# Patient Record
Sex: Female | Born: 1960
Health system: Southern US, Community
[De-identification: ages and names within clinical notes are randomized; demographics above are authoritative.]

## PROBLEM LIST (undated history)

## (undated) DIAGNOSIS — D219 Benign neoplasm of connective and other soft tissue, unspecified: Secondary | ICD-10-CM

## (undated) DIAGNOSIS — J4520 Mild intermittent asthma, uncomplicated: Secondary | ICD-10-CM

## (undated) DIAGNOSIS — M1990 Unspecified osteoarthritis, unspecified site: Secondary | ICD-10-CM

## (undated) DIAGNOSIS — C801 Malignant (primary) neoplasm, unspecified: Secondary | ICD-10-CM

## (undated) DIAGNOSIS — E079 Disorder of thyroid, unspecified: Secondary | ICD-10-CM

## (undated) DIAGNOSIS — I341 Nonrheumatic mitral (valve) prolapse: Secondary | ICD-10-CM

## (undated) DIAGNOSIS — I209 Angina pectoris, unspecified: Secondary | ICD-10-CM

## (undated) DIAGNOSIS — R112 Nausea with vomiting, unspecified: Secondary | ICD-10-CM

## (undated) DIAGNOSIS — R0600 Dyspnea, unspecified: Secondary | ICD-10-CM

## (undated) DIAGNOSIS — E039 Hypothyroidism, unspecified: Secondary | ICD-10-CM

## (undated) DIAGNOSIS — G4730 Sleep apnea, unspecified: Secondary | ICD-10-CM

## (undated) DIAGNOSIS — I10 Essential (primary) hypertension: Secondary | ICD-10-CM

## (undated) HISTORY — PX: SHOULDER SURGERY: SHX246

## (undated) HISTORY — PX: TOE SURGERY: SHX1073

## (undated) HISTORY — DX: Mild intermittent asthma, uncomplicated: J45.20

## (undated) HISTORY — DX: Disorder of thyroid, unspecified: E07.9

## (undated) HISTORY — DX: Nonrheumatic mitral (valve) prolapse: I34.1

---
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
## 2005-06-30 ENCOUNTER — Ambulatory Visit: Payer: Self-pay | Admitting: Unknown Physician Specialty

## 2005-07-12 ENCOUNTER — Ambulatory Visit: Payer: Self-pay | Admitting: Unknown Physician Specialty

## 2007-03-07 ENCOUNTER — Encounter: Payer: Self-pay | Admitting: Family Medicine

## 2007-05-08 ENCOUNTER — Ambulatory Visit: Payer: Self-pay | Admitting: Cardiology

## 2007-05-12 LAB — CONVERTED CEMR LAB: Pap Smear: NORMAL

## 2007-05-23 ENCOUNTER — Ambulatory Visit: Payer: Self-pay | Admitting: Unknown Physician Specialty

## 2007-06-06 ENCOUNTER — Ambulatory Visit: Payer: Self-pay | Admitting: Family Medicine

## 2007-06-06 DIAGNOSIS — D580 Hereditary spherocytosis: Secondary | ICD-10-CM

## 2007-06-06 DIAGNOSIS — M545 Low back pain, unspecified: Secondary | ICD-10-CM | POA: Insufficient documentation

## 2007-06-06 DIAGNOSIS — J309 Allergic rhinitis, unspecified: Secondary | ICD-10-CM | POA: Insufficient documentation

## 2007-06-06 DIAGNOSIS — N3010 Interstitial cystitis (chronic) without hematuria: Secondary | ICD-10-CM

## 2007-06-06 DIAGNOSIS — J45909 Unspecified asthma, uncomplicated: Secondary | ICD-10-CM | POA: Insufficient documentation

## 2007-06-06 DIAGNOSIS — Z8679 Personal history of other diseases of the circulatory system: Secondary | ICD-10-CM | POA: Insufficient documentation

## 2007-06-06 DIAGNOSIS — G43909 Migraine, unspecified, not intractable, without status migrainosus: Secondary | ICD-10-CM | POA: Insufficient documentation

## 2007-06-06 DIAGNOSIS — E049 Nontoxic goiter, unspecified: Secondary | ICD-10-CM | POA: Insufficient documentation

## 2007-06-07 ENCOUNTER — Encounter: Payer: Self-pay | Admitting: Family Medicine

## 2007-09-04 ENCOUNTER — Encounter: Payer: Self-pay | Admitting: Family Medicine

## 2007-09-26 ENCOUNTER — Ambulatory Visit: Payer: Self-pay | Admitting: Family Medicine

## 2007-09-26 DIAGNOSIS — R21 Rash and other nonspecific skin eruption: Secondary | ICD-10-CM

## 2007-10-16 DIAGNOSIS — J019 Acute sinusitis, unspecified: Secondary | ICD-10-CM

## 2007-10-19 ENCOUNTER — Ambulatory Visit: Payer: Self-pay | Admitting: *Deleted

## 2008-07-31 ENCOUNTER — Telehealth: Payer: Self-pay | Admitting: Family Medicine

## 2008-08-02 ENCOUNTER — Encounter (INDEPENDENT_AMBULATORY_CARE_PROVIDER_SITE_OTHER): Payer: Self-pay | Admitting: *Deleted

## 2008-10-01 ENCOUNTER — Ambulatory Visit: Payer: Self-pay | Admitting: Unknown Physician Specialty

## 2009-11-18 ENCOUNTER — Ambulatory Visit: Payer: Self-pay | Admitting: Unknown Physician Specialty

## 2009-12-01 ENCOUNTER — Emergency Department: Payer: Self-pay | Admitting: Emergency Medicine

## 2010-12-01 ENCOUNTER — Ambulatory Visit: Payer: Self-pay | Admitting: Unknown Physician Specialty

## 2011-02-23 NOTE — Assessment & Plan Note (Signed)
[facility] OFFICE NOTE   [name], [name]                     MRN:          [id number]  DATE:05/08/2007                            DOB:          [birth year]    I was asked by Dr.  Elease Hashimoto to evaluate Sophia [name], a very pleasant  50 year old white female, for palpitations and PVCs.   HISTORY OF PRESENT ILLNESS:  She carries a diagnosis of mitral valve  prolapse without significant mitral regurgitation since the late 80s.  She was treated with beta-blockers for a couple of years and then came  off of it. This was diagnosed by a 2D echocardiogram, which I do not  have access to.   Recently, she has been having a lot of problems with palpitations  particularly at night. She will wake up with her heart beating irregular  and she cannot sleep. Several weeks ago, when these first started back,  she was having problems even with activity. She would get a headache on  her treadmill and get a little lightheadedness and stop.   She was seen by Dr.  Arnoldo Hooker of [facility]. He performed a  24-hour Holter monitor which showed 4625 PVCs, no runs and no pairs.  There were 11 events of ventricular bigeminy, 2 of ventricular  trigeminy. There was no SVT or runs and no other arrhythmias.   When she pressed the event recorder it was associated with ventricular  premature beats, but there were also others that did not.   She had a stress echo which showed normal treadmill EKG without evidence  of ischemia or arrhythmia. She exercised for 9 minutes achieving 10  METs. She achieved 95% of her peak heart rate. Her blood pressure  response was adequate. There were no arrhythmias. Her stress echo showed  no evidence of ischemia.   She has not had blood work as best as I can tell.   PAST MEDICAL HISTORY:  She does not smoke, drink or use any illicit  drugs. She does use a little bit of caffeine, but on a previous  record  she was drinking two 20 ounce caffeinated beverages a day.   PAST SURGICAL HISTORY:  Splenectomy.   CURRENT MEDICATIONS:  None.   ALLERGIES:  TEQUIN. She has no history of a dye allergy.   FAMILY HISTORY:  Is really noncontributory.   SOCIAL HISTORY:  She is single. She has no children.   REVIEW OF SYSTEMS:  History of asthma and hereditary spherocytosis. She  has had a splenectomy.   PHYSICAL EXAMINATION:  Her blood pressure is 122/72, pulse 82 and  regular. Weight is 179.  HEENT: Normocephalic, atraumatic. PERRLA. Extra-ocular movements intact.  Sclerae are clear. Facial symmetry is normal. Carotid upstrokes are  equal bilaterally without bruits. There is no JVD.  Thyroid is not  enlarged. Trachea is midline.  LUNGS:  Are clear.  HEART: Reveals a nondisplaced PMI and she has a mid systolic click.  There is no significant murmur. S2 splits physiologically.  ABDOMEN: Soft with good bowel sounds. There is no midline bruit.  EXTREMITIES: Reveals no cyanosis, clubbing or edema. Pulses are intact.  NEURO: Intact.  SKIN: Is unremarkable.   EKG from outside on Mar 07, 2007, showed normal sinus rhythm, normal PR  interval, normal QRS, normal QTC and no abnormalities in V1 and V2.   ASSESSMENT/PLAN:  Benign tachy palpitations associated with ventricular  ectopy that is non-complex. She had some of this with exercise at least  historically. Beta-blockers have made her worse and really did not help  her. She really does not want to take a long acting drug. Her mitral  valve prolapse is stable.   RECOMMENDATIONS:  1. P.r.n. Diltiazem 60 mg for palpitations.  2. Regular exercise.  3. Reassurance.     Thomas C. Daleen Squibb, MD, [facility]  Electronically Signed    TCW/MedQ  DD: 05/08/2007  DT: 05/08/2007  Job #: [id number]   cc:   Lorie Phenix

## 2011-03-19 ENCOUNTER — Encounter: Payer: Self-pay | Admitting: Family Medicine

## 2011-12-03 ENCOUNTER — Ambulatory Visit: Payer: Self-pay | Admitting: Family Medicine

## 2012-08-10 ENCOUNTER — Ambulatory Visit: Payer: Self-pay | Admitting: Family Medicine

## 2013-01-11 ENCOUNTER — Ambulatory Visit: Payer: Self-pay | Admitting: Family Medicine

## 2013-08-07 ENCOUNTER — Ambulatory Visit: Payer: Self-pay | Admitting: Family Medicine

## 2013-08-14 ENCOUNTER — Ambulatory Visit: Payer: Self-pay | Admitting: Family Medicine

## 2013-10-26 DIAGNOSIS — E039 Hypothyroidism, unspecified: Secondary | ICD-10-CM | POA: Insufficient documentation

## 2013-11-01 ENCOUNTER — Ambulatory Visit: Payer: Self-pay | Admitting: Gastroenterology

## 2014-03-07 ENCOUNTER — Ambulatory Visit (INDEPENDENT_AMBULATORY_CARE_PROVIDER_SITE_OTHER): Payer: 59 | Admitting: Podiatrist

## 2014-03-07 ENCOUNTER — Encounter: Payer: Self-pay | Admitting: Podiatrist

## 2014-03-07 VITALS — BP 131/70 | HR 85 | Resp 16

## 2014-03-07 DIAGNOSIS — M205X2 Other deformities of toe(s) (acquired), left foot: Secondary | ICD-10-CM

## 2014-03-07 DIAGNOSIS — M7750 Other enthesopathy of unspecified foot: Secondary | ICD-10-CM

## 2014-03-07 DIAGNOSIS — M779 Enthesopathy, unspecified: Secondary | ICD-10-CM

## 2014-03-07 DIAGNOSIS — M205X9 Other deformities of toe(s) (acquired), unspecified foot: Secondary | ICD-10-CM

## 2014-03-07 DIAGNOSIS — M778 Other enthesopathies, not elsewhere classified: Secondary | ICD-10-CM

## 2014-03-07 MED ORDER — TRIAMCINOLONE ACETONIDE 10 MG/ML IJ SUSP
10.0000 mg | Freq: Once | INTRAMUSCULAR | Status: AC
  Administered 2014-03-07: 10 mg

## 2014-03-07 NOTE — Progress Notes (Signed)
Chief Complaint  Patient presents with  . Foot Pain    i want another shot in my left foot     HPI: Patient is 53 y.o. female who presents today for pain at the first mpj of the left foot.  She states the last injecdtion she got in august 2014 helped up till now and her back is starting to bother her from bearing weight differently on her foot.       Physical Exam GENERAL APPEARANCE: Alert, conversant. Appropriately groomed. No acute distress.  VASCULAR: Pedal pulses palpable at 2/4 DP and PT bilateral.  Capillary refill time is immediate to all digits,  Proximal to distal cooling it warm to warm.  Digital hair growth is present bilateral  NEUROLOGIC: sensation is intact epicritically and protectively to 5.07 monofilament at 5/5 sites bilateral.  Light touch is intact bilateral, vibratory sensation intact bilateral, achilles tendon reflex is intact bilateral.  MUSCULOSKELETAL: hallux limitus with decreased joint space on xray is noted.  Swelling at the first mpj is present.  Otherwise acceptable muscle strength, tone and stability bilateral.  Intrinsic muscluature intact bilateral.  Rectus appearance of foot and digits noted bilateral.   DERMATOLOGIC: skin color, texture, and turger are within normal limits.  No preulcerative lesions are seen, no interdigital maceration noted.  No open lesions present.  Digital nails are asymptomatic.    Assessment: Hallux limitus, capsulitis left  Plan: Injected the area under sterile technique at the first metatarsophalangeal joint left with Kenalog and Marcaine mixture. Discussed the possibility of an implant procedure however I recommended waiting until shots are no longer effective or and less the pain gets increasingly worse and worse. She will call if she needs another injection her for a surgical consultation otherwise she'll be seen back as needed for followup.

## 2014-03-07 NOTE — Patient Instructions (Signed)
Hallux Rigidus Hallux rigidus is a condition involving pain and a loss of motion of the first (big) toe. The pain gets worse with lifting up (extension) of the toe. This is usually due to arthritic bony bumps (spurring) of the joint at the base of the big toe.  SYMPTOMS   Pain, with lifting up of the toe.  Tenderness over the joint where the big toe meets the foot.  Redness, swelling, and warmth over the top of the base of the big toe (sometimes).  Foot pain, stiffness, and limping. CAUSES  Halllux rigidus is caused by arthritis of the joint where the big toe meets the foot. The arthritis creates a bone spur that pinches the soft tissues, when the toe is extended. RISK INCREASES WITH:  Tight shoes, with a narrow toe box.  Family history of foot problems.  Gout and rheumatoid and psoriatic arthritis.  History of previous toe injury, including "turf toe."  Long first toe, flat feet, and other big toe bony bumps.  Arthritis of the big toe. PREVENTION   Wear wide toed shoes that fit well.  Tape the big toe, to reduce motion and to prevent pinching of the tissues between the bone.  Maintain physical fitness:  Foot and ankle flexibility.  Muscle strength and endurance. PROGNOSIS  This condition can usually be managed with proper treatment. However, surgery is typically required to prevent the problem from recurring.  RELATED COMPLICATIONS  Injury to other areas of the foot or ankle, caused by abnormal walking in an attempt to avoid the pain felt when walking normally. TREATMENT Treatment first involves stopping the activities that aggravate your symptoms. Ice and medicine can be used to reduce the pain and inflammation. Modifications to shoes may help reduce pain, including wearing stiff-soled shoes, shoes with a wide toe box, inserting a padded donut to relieve pressure on top of the joint, or wearing an arch support. Corticosteroid injections may be given to reduce inflammation.  If non-surgical treatment is unsuccessful, surgery may be needed. Surgical options include removing the arthritic bony spur, cutting a bone in the foot to change the arc of motion (allowing the toe to extend more), or fusion of the joint (eliminating all motion in the joint at the base of the big toe).  MEDICATION   If pain medicine is needed, nonsteroidal anti-inflammatory medicines (aspirin and ibuprofen), or other minor pain relievers (acetaminophen), are often advised.  Do not take pain medicine for 7 days before surgery.  Prescription pain relievers are usually prescribed only after surgery. Use only as directed and only as much as you need.  Ointments for arthritis, applied to the skin, may give some relief.  Injections of corticosteroids may be given to reduce inflammation. HEAT AND COLD  Cold treatment (icing) relieves pain and reduces inflammation. Cold treatment should be applied for 10 to 15 minutes every 2 to 3 hours, and immediately after activity that aggravates your symptoms. Use ice packs or an ice massage.  Heat treatment may be used before performing the stretching and strengthening activities prescribed by your caregiver, physical therapist, or athletic trainer. Use a heat pack or a warm water soak. SEEK MEDICAL CARE IF:   Symptoms get worse or do not improve in 2 weeks, despite treatment.  After surgery you develop fever, increasing pain, redness, swelling, drainage of fluids, bleeding, or increasing warmth.  New, unexplained symptoms develop. (Drugs used in treatment may produce side effects.) Document Released: 09/27/2005 Document Revised: 12/20/2011 Document Reviewed: 01/09/2009 [employer] Patient   Information 2014 [employer].  

## 2015-02-11 ENCOUNTER — Other Ambulatory Visit: Payer: Self-pay | Admitting: Gastroenterology

## 2015-02-11 DIAGNOSIS — R748 Abnormal levels of other serum enzymes: Secondary | ICD-10-CM

## 2015-02-25 ENCOUNTER — Ambulatory Visit
Admission: RE | Admit: 2015-02-25 | Discharge: 2015-02-25 | Disposition: A | Discharge status: Home / Self Care | Payer: 59 | Source: Ambulatory Visit | Attending: Gastroenterology | Admitting: Gastroenterology

## 2015-02-25 ENCOUNTER — Other Ambulatory Visit: Payer: Self-pay | Admitting: Gastroenterology

## 2015-02-25 DIAGNOSIS — M79632 Pain in left forearm: Secondary | ICD-10-CM | POA: Insufficient documentation

## 2015-02-25 DIAGNOSIS — R748 Abnormal levels of other serum enzymes: Secondary | ICD-10-CM

## 2015-02-25 DIAGNOSIS — M7989 Other specified soft tissue disorders: Secondary | ICD-10-CM | POA: Insufficient documentation

## 2015-02-25 MED ORDER — GADOBENATE DIMEGLUMINE 529 MG/ML IV SOLN
15.0000 mL | Freq: Once | INTRAVENOUS | Status: AC | PRN
  Administered 2015-02-25: 15 mL via INTRAVENOUS

## 2015-02-27 ENCOUNTER — Other Ambulatory Visit: Payer: Self-pay | Admitting: Family Medicine

## 2015-02-27 ENCOUNTER — Ambulatory Visit
Admission: RE | Admit: 2015-02-27 | Discharge: 2015-02-27 | Disposition: A | Discharge status: Home / Self Care | Payer: 59 | Source: Ambulatory Visit | Attending: Family Medicine | Admitting: Family Medicine

## 2015-02-27 DIAGNOSIS — M79632 Pain in left forearm: Secondary | ICD-10-CM

## 2015-02-27 DIAGNOSIS — I809 Phlebitis and thrombophlebitis of unspecified site: Secondary | ICD-10-CM | POA: Insufficient documentation

## 2015-02-27 DIAGNOSIS — M7989 Other specified soft tissue disorders: Secondary | ICD-10-CM

## 2015-07-07 DIAGNOSIS — R748 Abnormal levels of other serum enzymes: Secondary | ICD-10-CM | POA: Insufficient documentation

## 2015-11-26 ENCOUNTER — Other Ambulatory Visit: Payer: Self-pay | Admitting: Family Medicine

## 2015-11-26 DIAGNOSIS — M542 Cervicalgia: Secondary | ICD-10-CM

## 2015-11-28 ENCOUNTER — Ambulatory Visit
Admission: RE | Admit: 2015-11-28 | Discharge: 2015-11-28 | Disposition: A | Discharge status: Home / Self Care | Payer: 59 | Source: Ambulatory Visit | Attending: Family Medicine | Admitting: Family Medicine

## 2015-11-28 DIAGNOSIS — M542 Cervicalgia: Secondary | ICD-10-CM | POA: Diagnosis not present

## 2015-11-28 DIAGNOSIS — R599 Enlarged lymph nodes, unspecified: Secondary | ICD-10-CM | POA: Insufficient documentation

## 2016-06-28 ENCOUNTER — Other Ambulatory Visit: Payer: Self-pay | Admitting: Family Medicine

## 2016-06-28 DIAGNOSIS — Z1231 Encounter for screening mammogram for malignant neoplasm of breast: Secondary | ICD-10-CM

## 2016-08-12 ENCOUNTER — Ambulatory Visit
Admission: RE | Admit: 2016-08-12 | Discharge: 2016-08-12 | Disposition: A | Discharge status: Home / Self Care | Payer: 59 | Source: Ambulatory Visit | Attending: Family Medicine | Admitting: Family Medicine

## 2016-08-12 DIAGNOSIS — Z1231 Encounter for screening mammogram for malignant neoplasm of breast: Secondary | ICD-10-CM | POA: Insufficient documentation

## 2016-08-19 ENCOUNTER — Other Ambulatory Visit: Payer: Self-pay | Admitting: *Deleted

## 2016-08-19 ENCOUNTER — Inpatient Hospital Stay
Admission: RE | Admit: 2016-08-19 | Discharge: 2016-08-19 | Disposition: A | Discharge status: Home / Self Care | Payer: Self-pay | Source: Ambulatory Visit | Attending: *Deleted | Admitting: *Deleted

## 2016-08-19 DIAGNOSIS — Z9289 Personal history of other medical treatment: Secondary | ICD-10-CM

## 2016-12-16 ENCOUNTER — Emergency Department
Admission: EM | Admit: 2016-12-16 | Discharge: 2016-12-17 | Disposition: A | Discharge status: Home / Self Care | Payer: 59 | Attending: Emergency Medicine | Admitting: Emergency Medicine

## 2016-12-16 ENCOUNTER — Emergency Department: Payer: 59

## 2016-12-16 ENCOUNTER — Encounter: Payer: Self-pay | Admitting: Emergency Medicine

## 2016-12-16 DIAGNOSIS — J45909 Unspecified asthma, uncomplicated: Secondary | ICD-10-CM | POA: Insufficient documentation

## 2016-12-16 DIAGNOSIS — R7989 Other specified abnormal findings of blood chemistry: Secondary | ICD-10-CM

## 2016-12-16 DIAGNOSIS — Z79899 Other long term (current) drug therapy: Secondary | ICD-10-CM | POA: Diagnosis not present

## 2016-12-16 DIAGNOSIS — R0789 Other chest pain: Secondary | ICD-10-CM | POA: Insufficient documentation

## 2016-12-16 DIAGNOSIS — R079 Chest pain, unspecified: Secondary | ICD-10-CM

## 2016-12-16 LAB — COMPREHENSIVE METABOLIC PANEL
ALK PHOS: 108 U/L (ref 38–126)
ALT: 34 U/L (ref 14–54)
AST: 34 U/L (ref 15–41)
Albumin: 4 g/dL (ref 3.5–5.0)
Anion gap: 7 (ref 5–15)
BUN: 10 mg/dL (ref 6–20)
CO2: 28 mmol/L (ref 22–32)
CREATININE: 0.8 mg/dL (ref 0.44–1.00)
Calcium: 8.8 mg/dL — ABNORMAL LOW (ref 8.9–10.3)
Chloride: 106 mmol/L (ref 101–111)
GFR calc Af Amer: >60 mL/min (ref >60)
GFR calc non Af Amer: >60 mL/min (ref >60)
GLUCOSE: 106 mg/dL — AB (ref 65–99)
Potassium: 3.6 mmol/L (ref 3.5–5.1)
SODIUM: 141 mmol/L (ref 135–145)
Total Bilirubin: 0.7 mg/dL (ref 0.3–1.2)
Total Protein: 7.3 g/dL (ref 6.5–8.1)

## 2016-12-16 LAB — CBC
HEMATOCRIT: 40.1 % (ref 35.0–47.0)
Hemoglobin: 14.8 g/dL (ref 12.0–16.0)
MCH: 34.7 pg — ABNORMAL HIGH (ref 26.0–34.0)
MCHC: 36.8 g/dL — ABNORMAL HIGH (ref 32.0–36.0)
MCV: 94.4 fL (ref 80.0–100.0)
Platelets: 576 10*3/uL — ABNORMAL HIGH (ref 150–440)
RBC: 4.25 MIL/uL (ref 3.80–5.20)
RDW: 13.3 % (ref 11.5–14.5)
WBC: 12 10*3/uL — AB (ref 3.6–11.0)

## 2016-12-16 LAB — FIBRIN DERIVATIVES D-DIMER (ARMC ONLY): Fibrin derivatives D-dimer (ARMC): 521.09 — ABNORMAL HIGH (ref 0.00–499.00)

## 2016-12-16 LAB — TROPONIN I: Troponin I: <0.03 ng/mL (ref <0.03)

## 2016-12-16 MED ORDER — LORAZEPAM 1 MG PO TABS: 1.0000 mg | ORAL_TABLET | Freq: Once | ORAL | Status: DC

## 2016-12-16 MED ORDER — IOPAMIDOL (ISOVUE-370) INJECTION 76%: 75.0000 mL | Freq: Once | INTRAVENOUS | Status: DC | PRN

## 2016-12-16 NOTE — ED Triage Notes (Signed)
Pt states chest, back, shoulder pain, heart palpatations and chest pain on and off x 2-3 weeks.

## 2016-12-16 NOTE — ED Notes (Signed)
Pt in US, introduced self to pt's family member

## 2016-12-16 NOTE — ED Provider Notes (Signed)
Time Seen: Approximately 2058  I have reviewed the triage notes  Chief Complaint: Chest Pain   History of Present Illness: Sophia [name] is a 56 y.o. female *who presents with some intermittent chest discomfort with heart palpitations over the last 2-3 weeks. She denies any shortness of breath or syncopal episode. She describes pain toward the left side of her chest into her back region. She denies any nausea, vomiting, fever, pleuritic component. She denies any leg pain or swelling and has no history of pulmonary emboli risk factors. She does have a history of hereditary for a cytosis along with asthma. She denies any exacerbation of her asthma type symptoms.  Past Medical History:  Diagnosis Date  . Allergic rhinitis   . Asthma, mild intermittent     Patient Active Problem List   Diagnosis Date Noted  . ACUTE SINUSITIS, UNSPECIFIED 10/16/2007  . RASH AND OTHER NONSPECIFIC SKIN ERUPTION 09/26/2007  . GOITER NOS 06/06/2007  . SPHEROCYTOSIS, HEREDITARY 06/06/2007  . MIGRAINE HEADACHE 06/06/2007  . ALLERGIC RHINITIS 06/06/2007  . ASTHMA 06/06/2007  . INTERSTITIAL CYSTITIS 06/06/2007  . LOW BACK PAIN, CHRONIC 06/06/2007  . Personal history of unspecified circulatory disease 06/06/2007    Past Surgical History:  Procedure Laterality Date  . SHOULDER SURGERY  1990s   Left  . SPLENECTOMY  1978    Past Surgical History:  Procedure Laterality Date  . SHOULDER SURGERY  1990s   Left  . SPLENECTOMY  1978    Current Outpatient Rx  . Order #: [id number] Class: Historical Med  . Order #: [id number] Class: Historical Med  . Order #: [id number] Class: Historical Med  . Order #: [id number] Class: Historical Med  . Order #: [id number] Class: Historical Med    Allergies:  Phenergan [promethazine hcl] and Sulfamethoxazole-trimethoprim  Family History: Family History  Problem Relation Age of Onset  . Obesity Mother   . Coronary artery disease Mother   . Diabetes Mother   . Heart  failure Mother     CHF  . Hypertension Father   . Stroke Maternal Grandmother     CVA  . Parkinsonism Maternal Grandfather   . Colon cancer Paternal Grandmother   . Stroke Paternal Grandmother     CVA  . Stroke Paternal Grandfather     CVA  . Breast cancer Other     Aunt    Social History: Social History  Substance Use Topics  . Smoking status: Never Smoker  . Smokeless tobacco: Never Used  . Alcohol use No     Review of Systems:   10 point review of systems was performed and was otherwise negative:  Constitutional: No fever Eyes: No visual disturbances ENT: No sore throat, ear pain Cardiac: Chest pain seems to be not associated with movement or fever or productive cough etc. She states she does have some of discomfort at this time. Respiratory: No shortness of breath, wheezing, or stridor Abdomen: No abdominal pain, no vomiting, No diarrhea Endocrine: No weight loss, No night sweats Extremities: No peripheral edema, cyanosis Skin: No rashes, easy bruising Neurologic: No focal weakness, trouble with speech or swollowing Urologic: No dysuria, Hematuria, or urinary frequency   Physical Exam:  ED Triage Vitals  Enc Vitals Group     BP 12/16/16 2022 (!) 160/82     Pulse Rate 12/16/16 2022 86     Resp 12/16/16 2022 16     Temp 12/16/16 2022 98.6 F (37 C)     Temp src --  SpO2 12/16/16 2022 98 %     Weight 12/16/16 2023 195 lb (88.5 kg)     Height 12/16/16 2023 5\' 6"  (1.676 m)     Head Circumference --      Peak Flow --      Pain Score 12/16/16 2023 3     Pain Loc --      Pain Edu? --      Excl. in GC? --     General: Awake , Alert , and Oriented times 3; GCS 15 Anxious Head: Normal cephalic , atraumatic Eyes: Pupils equal , round, reactive to light Nose/Throat: No nasal drainage, patent upper airway without erythema or exudate.  Neck: Supple, Full range of motion, No anterior adenopathy or palpable thyroid masses Lungs: Clear to ascultation without  wheezes , rhonchi, or rales Heart: Regular rate, regular rhythm with mild systolic ejection murmur left sternal border  Abdomen: Soft, non tender without rebound, guarding , or rigidity; bowel sounds positive and symmetric in all 4 quadrants. No organomegaly .        Extremities: 2 plus symmetric pulses. No edema, clubbing or cyanosis. No calf tenderness or swelling Neurologic: normal ambulation, Motor symmetric without deficits, sensory intact Skin: warm, dry, no rashes   Labs:   All laboratory work was reviewed including any pertinent negatives or positives listed below:  Labs Reviewed  CBC - Abnormal; Notable for the following:       Result Value   WBC 12.0 (*)    MCH 34.7 (*)    MCHC 36.8 (*)    Platelets 576 (*)    All other components within normal limits  FIBRIN DERIVATIVES D-DIMER ([facility] ONLY) - Abnormal; Notable for the following:    Fibrin derivatives D-dimer ([facility]) 521.09 (*)    All other components within normal limits  COMPREHENSIVE METABOLIC PANEL - Abnormal; Notable for the following:    Glucose, Bld 106 (*)    Calcium 8.8 (*)    All other components within normal limits  TROPONIN I    EKG: ED ECG REPORT I, Jennye Moccasin, the attending physician, personally viewed and interpreted this ECG.  Date: 12/16/2016 EKG Time: 2014 Rate: 97 Rhythm: normal sinus rhythm QRS Axis: normal Intervals: normal ST/T Wave abnormalities: normal Conduction Disturbances: none Narrative Interpretation: unremarkable    Radiology: * "Dg Chest 2 View  Result Date: 12/16/2016 CLINICAL DATA:  Chest back and shoulder pain with heart palpitations EXAM: CHEST  2 VIEW COMPARISON:  08/07/2013 FINDINGS: The heart size and mediastinal contours are within normal limits. Both lungs are clear. Degenerative changes of the spine. Surgical clip in the left upper quadrant as before. IMPRESSION: No active cardiopulmonary disease. Electronically Signed   By: Jasmine Pang M.D.   On: 12/16/2016  20:48   Ct Chest Wo Contrast  Result Date: 12/16/2016 CLINICAL DATA:  Cough and shoulder pain heart palpitations EXAM: CT CHEST WITHOUT CONTRAST TECHNIQUE: Multidetector CT imaging of the chest was performed following the standard protocol without IV contrast. COMPARISON:  Radiograph 12/16/2016, CT abdomen pelvis 08/14/2013 FINDINGS: Cardiovascular: Limited without intravenous contrast. Non aneurysmal aorta. Normal heart size. No significant pericardial effusion. Mediastinum/Nodes: No enlarged mediastinal or axillary lymph nodes. Thyroid gland, trachea, and esophagus demonstrate no significant findings. Lungs/Pleura: No acute infiltrate or effusion. Stable 5 mm right lower lobe pulmonary nodule. 2 mm subpleural right upper lobe pulmonary nodule. 4 mm subpleural left upper lobe pulmonary nodule. Upper Abdomen: No acute abnormality in the upper abdomen. Musculoskeletal: Degenerative changes. No acute  or suspicious bone lesion. IMPRESSION: 1. No acute abnormalities are visualized. Lung fields show no acute infiltrates, pleural effusion or pneumothorax 2. Scattered pulmonary nodules. No follow-up needed if patient is low-risk (and has no known or suspected primary neoplasm). Non-contrast chest CT can be considered in 12 months if patient is high-risk. This recommendation follows the consensus statement: Guidelines for Management of Incidental Pulmonary Nodules Detected on CT Images: From the Fleischner Society 2017; Radiology 2017; 284:228-243. Electronically Signed   By: Jasmine PangKim  Fujinaga M.D.   On: 12/16/2016 22:50  "  I personally reviewed the radiologic studies   ED Course: * Differential includes all life-threatening causes for chest pain. This includes but is not exclusive to acute coronary syndrome, aortic dissection, pulmonary embolism, cardiac tamponade, community-acquired pneumonia, pericarditis, musculoskeletal chest wall pain, etc.  Patient's presentation her chest pains are atypical. Her D-dimer  test was only borderline positive and unfortunately patient has a dye allergy. We performed a chest CT to rule out thoracic dissection there is no other pathology other than some pulmonary nodules which are likely incidental. The patient's awaiting Doppler studies will repeat her cardiac enzyme at a 3 hour note from her first levels. Thus far I don't have a strong suspicion for acute coronary syndrome. The patient has a history of mitral valve prolapse and likely require outpatient cardiology evaluation.     Assessment: * Acute atypical chest pain     Plan: * Repeat cardiac enzyme and Doppler studies to rule out DVT.          Jennye MoccasinBrian S Quigley, MD 12/16/16 [phone]

## 2016-12-16 NOTE — ED Notes (Signed)
Pt taken from CT to US via stretcher

## 2016-12-17 ENCOUNTER — Emergency Department: Admission: EM | Admit: 2016-12-17 | Discharge: 2016-12-17 | Discharge status: Against Medical Advice | Payer: 59

## 2016-12-17 ENCOUNTER — Other Ambulatory Visit: Payer: Self-pay | Admitting: Physician Assistant

## 2016-12-17 ENCOUNTER — Telehealth: Payer: Self-pay

## 2016-12-17 ENCOUNTER — Telehealth: Payer: Self-pay | Admitting: Physician Assistant

## 2016-12-17 ENCOUNTER — Ambulatory Visit: Payer: 59 | Admitting: Physician Assistant

## 2016-12-17 ENCOUNTER — Encounter
Admission: RE | Admit: 2016-12-17 | Discharge: 2016-12-17 | Disposition: A | Discharge status: Home / Self Care | Payer: 59 | Source: Ambulatory Visit | Attending: Physician Assistant | Admitting: Physician Assistant

## 2016-12-17 ENCOUNTER — Encounter: Payer: Self-pay | Admitting: Physician Assistant

## 2016-12-17 DIAGNOSIS — R079 Chest pain, unspecified: Secondary | ICD-10-CM

## 2016-12-17 DIAGNOSIS — R7989 Other specified abnormal findings of blood chemistry: Secondary | ICD-10-CM | POA: Insufficient documentation

## 2016-12-17 DIAGNOSIS — R0602 Shortness of breath: Secondary | ICD-10-CM

## 2016-12-17 LAB — TROPONIN I: Troponin I: <0.03 ng/mL (ref <0.03)

## 2016-12-17 MED ORDER — TECHNETIUM TO 99M ALBUMIN AGGREGATED
4.0000 | Freq: Once | INTRAVENOUS | Status: AC | PRN
  Administered 2016-12-17: 4.183 via INTRAVENOUS

## 2016-12-17 MED ORDER — TECHNETIUM TC 99M DIETHYLENETRIAME-PENTAACETIC ACID
30.0000 | Freq: Once | INTRAVENOUS | Status: AC | PRN
  Administered 2016-12-17: 32.938 via INTRAVENOUS

## 2016-12-17 NOTE — Progress Notes (Addendum)
    Patient was initially admitted to my schedule this afternoon for ED follow-up of chest pain. Upon initial chart review it was seen the patient had troponin negative 2, elevated d-dimer at 521, and white blood cell count 12,000. Patient underwent lower extremity ultrasound that was negative for bilateral DVT. Unfortunately, it appears the patient underwent CT chest without contrast rather than a CTA PE protocol study. Our office got back in touch with the patient to advise her this as well as to advise her to proceed to the ER for further workup of her elevated d-dimer. Patient reported she is allergic to contrast media which is why the CTA was not performed. ED performed CT chest without contrast, apparently to evaluate for thoracic dissection. Unfortunately this test does not exclude thoracic dissection nor does it evaluate for pulmonary embolism in the setting of her elevated d-dimer.   Upon reviewing patient's allergies that were apparently updated on 12/16/2016, there is no mention of a contrast allergy. If patient truly in fact does have contrast allergy she should be further evaluated with a VQ scan to evaluate for pulmonary embolism in the setting of her chest pain with abnormal d-dimer vs premedicating for contrast allergy and performing CTA chest if there is concern for thoracic dissection. Patient has been advised to go to the ER for appropriate workup.  Will defer adding contrast media to patient's allergies to the ED as day are currently directing the patient's care. Case was discussed with colleagues as well as administration who all agree with the above. Patient was given the number to the office of patient experience.

## 2016-12-17 NOTE — Telephone Encounter (Signed)
    Patient was informed that V/Q scan negative for PE. She will see Dr. Alvino Chapelingal next week.   Cline CrockKathryn Enyah Moman PA-C  [facility]

## 2016-12-17 NOTE — Telephone Encounter (Signed)
Physician assistant Eula Listenyan Dunn PA spoke with patient regarding positive labs and instructions to go to ER for VQ scan and then further work up for cardiology next week.

## 2016-12-17 NOTE — Telephone Encounter (Signed)
Lmov for patient to call back and schedule an ED fu with any provider She was seen for CP  Will try again at a later time

## 2016-12-17 NOTE — ED Provider Notes (Signed)
-----------------------------------------   1:14 AM on 12/17/2016 -----------------------------------------   Blood pressure 117/77, pulse 87, temperature 98.6 F (37 C), resp. rate 13, height 5\' 6"  (1.676 m), weight 195 lb (88.5 kg), SpO2 97 %.  Assuming care from Dr. Huel CoteQuigley of Derenda FennelBelinda D Vanvoorhis is a 56 y.o. female with a chief complaint of Chest Pain .    I was asked to f/u results of repeat troponin and if negative plan to dc patient home with f/u with PCP for pulmonary nodules and Cardiology. Patient referred to Dr. Lewie LoronGolan. Troponin x 2 negative. Patient remains asymptomatic. Will dc home.   Nita Sicklearolina Izola Teague, MD 12/17/16 [phone]

## 2016-12-17 NOTE — ED Notes (Signed)
Pt spoke with Eula Listenyan Dunn, PA who was able to schedule pt today for VQ scan on outpatient basis.

## 2016-12-17 NOTE — Telephone Encounter (Signed)
Left detailed voicemail message that patient needed precertification for testing and that has been done with instructions to call back if any questions.

## 2016-12-17 NOTE — Discharge Instructions (Signed)

## 2016-12-21 NOTE — Telephone Encounter (Signed)
Pt is coming 12/23/16 to see Dr Alvino ChapelIngal Nothing further needed.

## 2016-12-23 ENCOUNTER — Ambulatory Visit: Payer: 59 | Admitting: Cardiology

## 2016-12-23 ENCOUNTER — Encounter: Payer: Self-pay | Admitting: Cardiology

## 2016-12-23 ENCOUNTER — Ambulatory Visit (INDEPENDENT_AMBULATORY_CARE_PROVIDER_SITE_OTHER): Payer: 59 | Admitting: Cardiology

## 2016-12-23 VITALS — BP 142/82 | HR 92 | Ht 66.0 in | Wt 203.0 lb

## 2016-12-23 DIAGNOSIS — R0602 Shortness of breath: Secondary | ICD-10-CM

## 2016-12-23 DIAGNOSIS — R002 Palpitations: Secondary | ICD-10-CM | POA: Diagnosis not present

## 2016-12-23 DIAGNOSIS — R079 Chest pain, unspecified: Secondary | ICD-10-CM | POA: Diagnosis not present

## 2016-12-23 NOTE — Patient Instructions (Addendum)
Testing/Procedures: Your physician has requested that you have an echocardiogram. Echocardiography is a painless test that uses sound waves to create images of your heart. It provides your doctor with information about the size and shape of your heart and how well your heart's chambers and valves are working. This procedure takes approximately one hour. There are no restrictions for this procedure.  [facility]IEW  Your caregiver has ordered a Stress Test with nuclear imaging. The purpose of this test is to evaluate the blood supply to your heart muscle. This procedure is referred to as a "Non-Invasive Stress Test." This is because other than having an IV started in your vein, nothing is inserted or "invades" your body. Cardiac stress tests are done to find areas of poor blood flow to the heart by determining the extent of coronary artery disease (CAD). Some patients exercise on a treadmill, which naturally increases the blood flow to your heart, while others who are  unable to walk on a treadmill due to physical limitations have a pharmacologic/chemical stress agent called Lexiscan . This medicine will mimic walking on a treadmill by temporarily increasing your coronary blood flow.   Please note: these test may take anywhere between 2-4 hours to complete  [facility]  THE VOLUNTEERS AT THE FIRST DESK WILL DIRECT YOU WHERE TO GO  Date of Procedure:_Thursday December 30, 2016 at 09:00AM__  Arrival Time for Procedure:_Arrive at 08:45AM to register__    PLEASE NOTIFY THE OFFICE AT LEAST 24 HOURS IN ADVANCE IF YOU ARE UNABLE TO KEEP YOUR APPOINTMENT.  [phone] AND  PLEASE NOTIFY [facility] AT [facility] AT LEAST 24 HOURS IN ADVANCE IF YOU ARE UNABLE TO KEEP YOUR APPOINTMENT. [phone]  How to prepare for your Myoview test:  1. Do not eat or drink after midnight 2. No caffeine for 24 hours prior to test 3. No smoking 24 hours prior to test. 4. Your medication  may be taken with water.  If your doctor stopped a medication because of this test, do not take that medication. 5. Ladies, please do not wear dresses.  Skirts or pants are appropriate. Please wear a short sleeve shirt. 6. No perfume, cologne or lotion. 7. Wear comfortable walking shoes. No heels!   Follow-Up: Your physician recommends that you schedule a follow-up appointment after testing.   It was a pleasure seeing you today here in the office. Please do not hesitate to give us a call back if you have any further questions. [phone]  Sheffield CellarPamela A. RN, BSN    Echocardiogram An echocardiogram, or echocardiography, uses sound waves (ultrasound) to produce an image of your heart. The echocardiogram is simple, painless, obtained within a short period of time, and offers valuable information to your health care provider. The images from an echocardiogram can provide information such as:  Evidence of coronary artery disease (CAD).  Heart size.  Heart muscle function.  Heart valve function.  Aneurysm detection.  Evidence of a past heart attack.  Fluid buildup around the heart.  Heart muscle thickening.  Assess heart valve function. Tell a health care provider about:  Any allergies you have.  All medicines you are taking, including vitamins, herbs, eye drops, creams, and over-the-counter medicines.  Any problems you or family members have had with anesthetic medicines.  Any blood disorders you have.  Any surgeries you have had.  Any medical conditions you have.  Whether you are pregnant or may be pregnant. What happens before the procedure? No  special preparation is needed. Eat and drink normally. What happens during the procedure?  In order to produce an image of your heart, gel will be applied to your chest and a wand-like tool (transducer) will be moved over your chest. The gel will help transmit the sound waves from the transducer. The sound waves will harmlessly  bounce off your heart to allow the heart images to be captured in real-time motion. These images will then be recorded.  You may need an IV to receive a medicine that improves the quality of the pictures. What happens after the procedure? You may return to your normal schedule including diet, activities, and medicines, unless your health care provider tells you otherwise. This information is not intended to replace advice given to you by your health care provider. Make sure you discuss any questions you have with your health care provider. Document Released: 09/24/2000 Document Revised: 05/15/2016 Document Reviewed: 06/04/2013 Elsevier Interactive Patient Education  2017 [employer] Pharmacologic Stress Electrocardiogram Introduction A pharmacologic stress electrocardiogram is a heart (cardiac) test that uses nuclear imaging to evaluate the blood supply to your heart. This test may also be called a pharmacologic stress electrocardiography. Pharmacologic means that a medicine is used to increase your heart rate and blood pressure. This stress test is done to find areas of poor blood flow to the heart by determining the extent of coronary artery disease (CAD). Some people exercise on a treadmill, which naturally increases the blood flow to the heart. For those people unable to exercise on a treadmill, a medicine is used. This medicine stimulates your heart and will cause your heart to beat harder and more quickly, as if you were exercising. Pharmacologic stress tests can help determine:  The adequacy of blood flow to your heart during increased levels of activity in order to clear you for discharge home.  The extent of coronary artery blockage caused by CAD.  Your prognosis if you have suffered a heart attack.  The effectiveness of cardiac procedures done, such as an angioplasty, which can increase the circulation in your coronary arteries.  Causes of chest pain or pressure. LET [facility]  CARE PROVIDER KNOW ABOUT:  Any allergies you have.  All medicines you are taking, including vitamins, herbs, eye drops, creams, and over-the-counter medicines.  Previous problems you or members of your family have had with the use of anesthetics.  Any blood disorders you have.  Previous surgeries you have had.  Medical conditions you have.  Possibility of pregnancy, if this applies.  If you are currently breastfeeding. RISKS AND COMPLICATIONS Generally, this is a safe procedure. However, as with any procedure, complications can occur. Possible complications include:  You develop pain or pressure in the following areas:  Chest.  Jaw or neck.  Between your shoulder blades.  Radiating down your left arm.  Headache.  Dizziness or light-headedness.  Shortness of breath.  Increased or irregular heartbeat.  Low blood pressure.  Nausea or vomiting.  Flushing.  Redness going up the arm and slight pain during injection of medicine.  Heart attack (rare). BEFORE THE PROCEDURE  Avoid all forms of caffeine for 24 hours before your test or as directed by your health care provider. This includes coffee, tea (even decaffeinated tea), caffeinated sodas, chocolate, cocoa, and certain pain medicines.  Follow your health care provider's instructions regarding eating and drinking before the test.  Take your medicines as directed at regular times with water unless instructed otherwise. Exceptions may include:  If you  have diabetes, ask how you are to take your insulin or pills. It is common to adjust insulin dosing the morning of the test.  If you are taking beta-blocker medicines, it is important to talk to your health care provider about these medicines well before the date of your test. Taking beta-blocker medicines may interfere with the test. In some cases, these medicines need to be changed or stopped 24 hours or more before the test.  If you wear a nitroglycerin patch, it  may need to be removed prior to the test. Ask your health care provider if the patch should be removed before the test.  If you use an inhaler for any breathing condition, bring it with you to the test.  If you are an outpatient, bring a snack so you can eat right after the stress phase of the test.  Do not smoke for 4 hours prior to the test or as directed by your health care provider.  Do not apply lotions, powders, creams, or oils on your chest prior to the test.  Wear comfortable shoes and clothing. Let your health care provider know if you were unable to complete or follow the preparations for your test. PROCEDURE  Multiple patches (electrodes) will be put on your chest. If needed, small areas of your chest may be shaved to get better contact with the electrodes. Once the electrodes are attached to your body, multiple wires will be attached to the electrodes, and your heart rate will be monitored.  An IV access will be started. A nuclear trace (isotope) is given. The isotope may be given intravenously, or it may be swallowed. Nuclear refers to several types of radioactive isotopes, and the nuclear isotope lights up the arteries so that the nuclear images are clear. The isotope is absorbed by your body. This results in low radiation exposure.  A resting nuclear image is taken to show how your heart functions at rest.  A medicine is given through the IV access.  A second scan is done about 1 hour after the medicine injection and determines how your heart functions under stress.  During this stress phase, you will be connected to an electrocardiogram machine. Your blood pressure and oxygen levels will be monitored. What to expect after the procedure  Your heart rate and blood pressure will be monitored after the test.  You may return to your normal schedule, including diet,activities, and medicines, unless your health care provider tells you otherwise. This information is not  intended to replace advice given to you by your health care provider. Make sure you discuss any questions you have with your health care provider. Document Released: 02/13/2009 Document Revised: 03/04/2016 Document Reviewed: 04/05/2016 Elsevier Interactive Patient Education  2017 [employer].

## 2016-12-23 NOTE — Progress Notes (Addendum)
Cardiology Office Note   Date:  12/23/2016   ID:  Sophia [name], DOB 09-13-1961, MRN [id number]  Referring Doctor:  Rolm Gala, MD   Cardiologist:   Almond Lint, MD   Reason for consultation:  Chief Complaint  Patient presents with  . other    Former Wall pt c/o chest pain, rapid heart beat, sob, thigh numbness and shoulder/neck pain. Meds reviewed verbally with pt.      History of Present Illness: Sophia [name] is a 56 y.o. female who presents for Chest pain, shortness of breath, palpitations  Her symptoms have been going on for quite some time now, a few weeks. Since last week, she has had more intense episodes of the palpitations and the chest pain and shortness of breath.  The palpitations are described as a racing heart, associated with exertion, moderate intensity, lasting a few minutes at a time.  Chest pain and shortness of breath going on with exertion. Heaviness or tightness in the chest, moderate to severe intensity, lasting minutes at a time, associated with exertion. Nonradiating.  She was in the ER for the same symptoms. She was ruled out for ACS. Her d-dimer was noted to be elevated. She was allergic to contrast and therefore a VQ scan was essentially eventually ordered. That was negative for PE.  ROS:  Please see the history of present illness. Aside from mentioned under HPI, all other systems are reviewed and negative.     Past Medical History:  Diagnosis Date  . Allergic rhinitis   . Asthma, mild intermittent   . Mitral valve prolapse   . Thyroid disease     Past Surgical History:  Procedure Laterality Date  . SHOULDER SURGERY  1990s   Left  . SPLENECTOMY  1978  . TOE SURGERY       reports that she has never smoked. She has never used smokeless tobacco. She reports that she does not drink alcohol or use drugs.   family history includes Breast cancer in her other; Colon cancer in her paternal grandmother; Coronary artery disease in  her mother; Diabetes in her mother; Heart failure in her mother; Hypertension in her father; Obesity in her mother; Parkinsonism in her maternal grandfather; Stroke in her maternal grandmother, paternal grandfather, and paternal grandmother.   Outpatient Medications Prior to Visit  Medication Sig Dispense Refill  . fluticasone (FLONASE) 50 MCG/ACT nasal spray Place 2 sprays into the nose as needed.      Marland Kitchen levothyroxine (SYNTHROID, LEVOTHROID) 50 MCG tablet Take 50 mcg by mouth daily before breakfast.    . Omega-3 Fatty Acids (FISH OIL CONCENTRATE PO) Take 120 mg by mouth 3 (three) times daily.      . Cyanocobalamin (B-12 PO) Take by mouth daily.    . pirbuterol (MAXAIR AUTOHALER) 200 MCG/INH inhaler Inhale 2 puffs into the lungs as needed.       No facility-administered medications prior to visit.      Allergies: Contrast media [iodinated diagnostic agents]; Codeine; Phenergan [promethazine hcl]; Sulfamethoxazole-trimethoprim; Tequin [gatifloxacin]; and Latex    PHYSICAL EXAM: VS:  BP (!) 142/82 (BP Location: Right Arm, Patient Position: Sitting, Cuff Size: Normal)   Pulse 92   Ht 5\' 6"  (1.676 m)   Wt 203 lb (92.1 kg)   BMI 32.77 kg/m  , Body mass index is 32.77 kg/m. Wt Readings from Last 3 Encounters:  12/23/16 203 lb (92.1 kg)  12/16/16 195 lb (88.5 kg)  02/25/15 183 lb (83 kg)  Orthostatic VS for the past 24 hrs:  BP- Lying Pulse- Lying BP- Sitting Pulse- Sitting BP- Standing at 0 minutes Pulse- Standing at 0 minutes  12/23/16 1323 144/82 93 140/84 96 138/78 90     GENERAL:  well developed, well nourished,obese, not in acute distress HEENT: normocephalic, pink conjunctivae, anicteric sclerae, no xanthelasma, normal dentition, oropharynx clear NECK:  no neck vein engorgement, JVP normal, no hepatojugular reflux, carotid upstroke brisk and symmetric, no bruit, no thyromegaly, no lymphadenopathy LUNGS:  good respiratory effort, clear to auscultation bilaterally CV:  PMI not  displaced, no thrills, no lifts, S1 and S2 within normal limits, no palpable S3 or S4, no murmurs, no rubs, no gallops ABD:  Soft, nontender, nondistended, normoactive bowel sounds, no abdominal aortic bruit, no hepatomegaly, no splenomegaly MS: nontender back, no kyphosis, no scoliosis, no joint deformities EXT:  2+ DP/PT pulses, no edema, no varicosities, no cyanosis, no clubbing SKIN: warm, nondiaphoretic, normal turgor, no ulcers NEUROPSYCH: alert, oriented to person, place, and time, sensory/motor grossly intact, normal mood, appropriate affect  Recent Labs: 12/16/2016: ALT 34; BUN 10; Creatinine, Ser 0.80; Hemoglobin 14.8; Platelets 576; Potassium 3.6; Sodium 141   Lipid Panel No results found for: CHOL, TRIG, HDL, CHOLHDL, VLDL, LDLCALC, LDLDIRECT   Other studies Reviewed:  EKG:  The ekg from 12/23/2016 was personally reviewed by me and it revealed sinus rhythm. First-degree AV block, PR interval 214 ms. Nonspecific ST-T wave changes.  Additional studies/ records that were reviewed personally reviewed by me today include:  VQ scan 12/17/2016: FINDINGS: Ventilation: No focal ventilation defect.  Perfusion: No wedge shaped peripheral perfusion defects to suggest acute pulmonary embolism.  IMPRESSION: Normal perfusion study.  ASSESSMENT AND PLAN: Chest pain and shortness of breath Recommend further evaluation with echocardiogram and pharmacologic nuclear stress test. Patient cannot walk the treadmill due to recent foot injury. Next  Palpitations Per patient, this occurs separately from the chest pain and shortness of breath. Recommend long-term monitor since her symptoms are not on a daily basis. She would like to think about this first she has an allergic reaction to the adhesive.  Recommend to monitor blood pressure with a blood pressure log. It could be that her symptoms are related to hypertensive response to exertion.  Current medicines are reviewed at length with the  patient today.  The patient does not have concerns regarding medicines.  Labs/ tests ordered today include:  Orders Placed This Encounter  Procedures  . NM Myocar Multi W/Spect W/Wall Motion / EF  . EKG 12-Lead  . ECHOCARDIOGRAM COMPLETE    I had a lengthy and detailed discussion with the patient regarding diagnoses, prognosis, diagnostic options, treatment options , and side effects of medications.   I counseled the patient on importance of lifestyle modification including heart healthy diet, regular physical activity Once cardiac workup is completed.   Disposition:   FU with Cardiology after tests   Thank you for this consultation. We will forwarding this consultation to referring physician.   Signed, Almond LintAileen Reeve Turnley, MD  12/23/2016 2:44 PM    [facility]  This note was generated in part with voice recognition software and I apologize for any typographical errors that were not detected and corrected.

## 2016-12-28 ENCOUNTER — Ambulatory Visit (INDEPENDENT_AMBULATORY_CARE_PROVIDER_SITE_OTHER): Payer: 59

## 2016-12-28 ENCOUNTER — Other Ambulatory Visit: Payer: Self-pay

## 2016-12-28 DIAGNOSIS — R079 Chest pain, unspecified: Secondary | ICD-10-CM

## 2016-12-28 DIAGNOSIS — R002 Palpitations: Secondary | ICD-10-CM

## 2016-12-28 DIAGNOSIS — R0602 Shortness of breath: Secondary | ICD-10-CM | POA: Diagnosis not present

## 2016-12-30 ENCOUNTER — Ambulatory Visit
Admission: RE | Admit: 2016-12-30 | Discharge: 2016-12-30 | Disposition: A | Discharge status: Home / Self Care | Payer: 59 | Source: Ambulatory Visit | Attending: Cardiology | Admitting: Cardiology

## 2016-12-30 ENCOUNTER — Telehealth: Payer: Self-pay | Admitting: *Deleted

## 2016-12-30 DIAGNOSIS — R079 Chest pain, unspecified: Secondary | ICD-10-CM | POA: Diagnosis not present

## 2016-12-30 DIAGNOSIS — R002 Palpitations: Secondary | ICD-10-CM | POA: Insufficient documentation

## 2016-12-30 DIAGNOSIS — R0602 Shortness of breath: Secondary | ICD-10-CM | POA: Diagnosis not present

## 2016-12-30 LAB — NM MYOCAR MULTI W/SPECT W/WALL MOTION / EF
CHL CUP NUCLEAR SSS: 9
CSEPHR: 77 %
LV sys vol: 38 mL
LVDIAVOL: 87 mL (ref 46–106)
NUC STRESS TID: 0.92
Peak HR: 127 {beats}/min
Rest HR: 81 {beats}/min
SDS: 2
SRS: 7

## 2016-12-30 MED ORDER — REGADENOSON 0.4 MG/5ML IV SOLN
0.4000 mg | Freq: Once | INTRAVENOUS | Status: AC
  Administered 2016-12-30: 0.4 mg via INTRAVENOUS

## 2016-12-30 MED ORDER — TECHNETIUM TC 99M TETROFOSMIN IV KIT
13.5500 | PACK | Freq: Once | INTRAVENOUS | Status: AC | PRN
  Administered 2016-12-30: 13.55 via INTRAVENOUS

## 2016-12-30 MED ORDER — TECHNETIUM TC 99M TETROFOSMIN IV KIT
31.8700 | PACK | Freq: Once | INTRAVENOUS | Status: AC | PRN
  Administered 2016-12-30: 31.87 via INTRAVENOUS

## 2016-12-30 NOTE — Telephone Encounter (Signed)
-----   Message from Almond LintAileen Ingal, MD sent at 12/30/2016  1:56 PM EDT ----- Low risk, no ischemia, reassuring

## 2016-12-30 NOTE — Telephone Encounter (Signed)
Spoke with patient and let her know that Dr. Alvino ChapelIngal recommends that she speak with her PCP about possible referral to pulmonary. This would allow them to send their recommendations and keep her PCP in the loop. She verbalized understanding of our conversation, agreement with plan, and had no further questions at this time. Also confirmed upcoming appointment with Dr. Alvino ChapelIngal. She was appreciative for the call back.

## 2016-12-30 NOTE — Telephone Encounter (Signed)
Best for PCP to do referral so that recommendations will be received by PCP. Thank you.

## 2016-12-30 NOTE — Telephone Encounter (Signed)
Reviewed results with patient and she was glad but wants to know if we could refer her to a pulmonary physician for further evaluation. Let her know that I would have to discuss with Dr. Alvino ChapelIngal and that I would give her a call back. She was appreciative for the call and had no further questions at this time.

## 2017-01-18 ENCOUNTER — Ambulatory Visit: Payer: 59 | Admitting: Cardiology

## 2017-07-12 ENCOUNTER — Other Ambulatory Visit: Payer: Self-pay | Admitting: Family Medicine

## 2017-07-12 DIAGNOSIS — Z1231 Encounter for screening mammogram for malignant neoplasm of breast: Secondary | ICD-10-CM

## 2017-08-16 ENCOUNTER — Ambulatory Visit
Admission: RE | Admit: 2017-08-16 | Discharge: 2017-08-16 | Disposition: A | Discharge status: Home / Self Care | Payer: 59 | Source: Ambulatory Visit | Attending: Family Medicine | Admitting: Family Medicine

## 2017-08-16 DIAGNOSIS — Z1231 Encounter for screening mammogram for malignant neoplasm of breast: Secondary | ICD-10-CM | POA: Diagnosis present

## 2017-10-12 DIAGNOSIS — M5432 Sciatica, left side: Secondary | ICD-10-CM | POA: Diagnosis not present

## 2017-10-12 DIAGNOSIS — M9903 Segmental and somatic dysfunction of lumbar region: Secondary | ICD-10-CM | POA: Diagnosis not present

## 2017-10-12 DIAGNOSIS — M9905 Segmental and somatic dysfunction of pelvic region: Secondary | ICD-10-CM | POA: Diagnosis not present

## 2017-11-07 DIAGNOSIS — M9905 Segmental and somatic dysfunction of pelvic region: Secondary | ICD-10-CM | POA: Diagnosis not present

## 2017-11-07 DIAGNOSIS — M5432 Sciatica, left side: Secondary | ICD-10-CM | POA: Diagnosis not present

## 2017-11-07 DIAGNOSIS — M9903 Segmental and somatic dysfunction of lumbar region: Secondary | ICD-10-CM | POA: Diagnosis not present

## 2017-12-05 DIAGNOSIS — M9903 Segmental and somatic dysfunction of lumbar region: Secondary | ICD-10-CM | POA: Diagnosis not present

## 2017-12-05 DIAGNOSIS — M9905 Segmental and somatic dysfunction of pelvic region: Secondary | ICD-10-CM | POA: Diagnosis not present

## 2017-12-05 DIAGNOSIS — M5432 Sciatica, left side: Secondary | ICD-10-CM | POA: Diagnosis not present

## 2018-01-03 DIAGNOSIS — M9905 Segmental and somatic dysfunction of pelvic region: Secondary | ICD-10-CM | POA: Diagnosis not present

## 2018-01-03 DIAGNOSIS — M9903 Segmental and somatic dysfunction of lumbar region: Secondary | ICD-10-CM | POA: Diagnosis not present

## 2018-01-03 DIAGNOSIS — M5432 Sciatica, left side: Secondary | ICD-10-CM | POA: Diagnosis not present

## 2018-02-07 DIAGNOSIS — Z0000 Encounter for general adult medical examination without abnormal findings: Secondary | ICD-10-CM | POA: Diagnosis not present

## 2018-02-07 DIAGNOSIS — E538 Deficiency of other specified B group vitamins: Secondary | ICD-10-CM | POA: Insufficient documentation

## 2018-02-07 DIAGNOSIS — R748 Abnormal levels of other serum enzymes: Secondary | ICD-10-CM | POA: Diagnosis not present

## 2018-02-07 DIAGNOSIS — E039 Hypothyroidism, unspecified: Secondary | ICD-10-CM | POA: Diagnosis not present

## 2018-02-13 DIAGNOSIS — E538 Deficiency of other specified B group vitamins: Secondary | ICD-10-CM | POA: Diagnosis not present

## 2018-02-14 DIAGNOSIS — M9901 Segmental and somatic dysfunction of cervical region: Secondary | ICD-10-CM | POA: Diagnosis not present

## 2018-02-14 DIAGNOSIS — M62830 Muscle spasm of back: Secondary | ICD-10-CM | POA: Diagnosis not present

## 2018-02-14 DIAGNOSIS — M9903 Segmental and somatic dysfunction of lumbar region: Secondary | ICD-10-CM | POA: Diagnosis not present

## 2018-02-24 DIAGNOSIS — E669 Obesity, unspecified: Secondary | ICD-10-CM | POA: Diagnosis not present

## 2018-02-24 DIAGNOSIS — M65311 Trigger thumb, right thumb: Secondary | ICD-10-CM | POA: Diagnosis not present

## 2018-03-02 DIAGNOSIS — R748 Abnormal levels of other serum enzymes: Secondary | ICD-10-CM | POA: Diagnosis not present

## 2018-03-03 ENCOUNTER — Other Ambulatory Visit: Payer: Self-pay | Admitting: Internal Medicine

## 2018-03-03 DIAGNOSIS — R748 Abnormal levels of other serum enzymes: Secondary | ICD-10-CM

## 2018-03-14 ENCOUNTER — Ambulatory Visit
Admission: RE | Admit: 2018-03-14 | Discharge: 2018-03-14 | Disposition: A | Discharge status: Home / Self Care | Payer: 59 | Source: Ambulatory Visit | Attending: Internal Medicine | Admitting: Internal Medicine

## 2018-03-14 DIAGNOSIS — R748 Abnormal levels of other serum enzymes: Secondary | ICD-10-CM | POA: Diagnosis not present

## 2018-03-14 DIAGNOSIS — M9901 Segmental and somatic dysfunction of cervical region: Secondary | ICD-10-CM | POA: Diagnosis not present

## 2018-03-14 DIAGNOSIS — M9903 Segmental and somatic dysfunction of lumbar region: Secondary | ICD-10-CM | POA: Diagnosis not present

## 2018-03-14 DIAGNOSIS — E538 Deficiency of other specified B group vitamins: Secondary | ICD-10-CM | POA: Diagnosis not present

## 2018-03-14 DIAGNOSIS — M62830 Muscle spasm of back: Secondary | ICD-10-CM | POA: Diagnosis not present

## 2018-03-16 DIAGNOSIS — M9903 Segmental and somatic dysfunction of lumbar region: Secondary | ICD-10-CM | POA: Diagnosis not present

## 2018-03-16 DIAGNOSIS — M9901 Segmental and somatic dysfunction of cervical region: Secondary | ICD-10-CM | POA: Diagnosis not present

## 2018-03-16 DIAGNOSIS — M62830 Muscle spasm of back: Secondary | ICD-10-CM | POA: Diagnosis not present

## 2018-03-21 DIAGNOSIS — M62830 Muscle spasm of back: Secondary | ICD-10-CM | POA: Diagnosis not present

## 2018-03-21 DIAGNOSIS — M9903 Segmental and somatic dysfunction of lumbar region: Secondary | ICD-10-CM | POA: Diagnosis not present

## 2018-03-21 DIAGNOSIS — M9901 Segmental and somatic dysfunction of cervical region: Secondary | ICD-10-CM | POA: Diagnosis not present

## 2018-03-24 DIAGNOSIS — M9903 Segmental and somatic dysfunction of lumbar region: Secondary | ICD-10-CM | POA: Diagnosis not present

## 2018-03-24 DIAGNOSIS — M62830 Muscle spasm of back: Secondary | ICD-10-CM | POA: Diagnosis not present

## 2018-03-24 DIAGNOSIS — M9901 Segmental and somatic dysfunction of cervical region: Secondary | ICD-10-CM | POA: Diagnosis not present

## 2018-03-28 DIAGNOSIS — M9901 Segmental and somatic dysfunction of cervical region: Secondary | ICD-10-CM | POA: Diagnosis not present

## 2018-03-28 DIAGNOSIS — M9903 Segmental and somatic dysfunction of lumbar region: Secondary | ICD-10-CM | POA: Diagnosis not present

## 2018-03-28 DIAGNOSIS — M62830 Muscle spasm of back: Secondary | ICD-10-CM | POA: Diagnosis not present

## 2018-04-11 DIAGNOSIS — M62830 Muscle spasm of back: Secondary | ICD-10-CM | POA: Diagnosis not present

## 2018-04-11 DIAGNOSIS — M9901 Segmental and somatic dysfunction of cervical region: Secondary | ICD-10-CM | POA: Diagnosis not present

## 2018-04-11 DIAGNOSIS — M9903 Segmental and somatic dysfunction of lumbar region: Secondary | ICD-10-CM | POA: Diagnosis not present

## 2018-04-14 DIAGNOSIS — E538 Deficiency of other specified B group vitamins: Secondary | ICD-10-CM | POA: Diagnosis not present

## 2018-05-02 DIAGNOSIS — M9903 Segmental and somatic dysfunction of lumbar region: Secondary | ICD-10-CM | POA: Diagnosis not present

## 2018-05-02 DIAGNOSIS — M9901 Segmental and somatic dysfunction of cervical region: Secondary | ICD-10-CM | POA: Diagnosis not present

## 2018-05-02 DIAGNOSIS — M62830 Muscle spasm of back: Secondary | ICD-10-CM | POA: Diagnosis not present

## 2018-05-09 DIAGNOSIS — G4733 Obstructive sleep apnea (adult) (pediatric): Secondary | ICD-10-CM | POA: Diagnosis not present

## 2018-05-19 DIAGNOSIS — E538 Deficiency of other specified B group vitamins: Secondary | ICD-10-CM | POA: Diagnosis not present

## 2018-05-23 DIAGNOSIS — M9903 Segmental and somatic dysfunction of lumbar region: Secondary | ICD-10-CM | POA: Diagnosis not present

## 2018-05-23 DIAGNOSIS — M9901 Segmental and somatic dysfunction of cervical region: Secondary | ICD-10-CM | POA: Diagnosis not present

## 2018-05-23 DIAGNOSIS — M62830 Muscle spasm of back: Secondary | ICD-10-CM | POA: Diagnosis not present

## 2018-06-18 DIAGNOSIS — J329 Chronic sinusitis, unspecified: Secondary | ICD-10-CM | POA: Diagnosis not present

## 2018-06-18 DIAGNOSIS — B9689 Other specified bacterial agents as the cause of diseases classified elsewhere: Secondary | ICD-10-CM | POA: Diagnosis not present

## 2018-06-18 DIAGNOSIS — R05 Cough: Secondary | ICD-10-CM | POA: Diagnosis not present

## 2018-06-19 DIAGNOSIS — M9903 Segmental and somatic dysfunction of lumbar region: Secondary | ICD-10-CM | POA: Diagnosis not present

## 2018-06-19 DIAGNOSIS — M9901 Segmental and somatic dysfunction of cervical region: Secondary | ICD-10-CM | POA: Diagnosis not present

## 2018-06-19 DIAGNOSIS — M62830 Muscle spasm of back: Secondary | ICD-10-CM | POA: Diagnosis not present

## 2018-06-26 DIAGNOSIS — E538 Deficiency of other specified B group vitamins: Secondary | ICD-10-CM | POA: Diagnosis not present

## 2018-07-11 DIAGNOSIS — R0781 Pleurodynia: Secondary | ICD-10-CM | POA: Diagnosis not present

## 2018-07-18 DIAGNOSIS — M62830 Muscle spasm of back: Secondary | ICD-10-CM | POA: Diagnosis not present

## 2018-07-18 DIAGNOSIS — M9901 Segmental and somatic dysfunction of cervical region: Secondary | ICD-10-CM | POA: Diagnosis not present

## 2018-07-18 DIAGNOSIS — M9903 Segmental and somatic dysfunction of lumbar region: Secondary | ICD-10-CM | POA: Diagnosis not present

## 2018-07-20 DIAGNOSIS — R0781 Pleurodynia: Secondary | ICD-10-CM | POA: Diagnosis not present

## 2018-07-20 DIAGNOSIS — Z23 Encounter for immunization: Secondary | ICD-10-CM | POA: Diagnosis not present

## 2018-07-31 DIAGNOSIS — E538 Deficiency of other specified B group vitamins: Secondary | ICD-10-CM | POA: Diagnosis not present

## 2018-08-03 IMAGING — CT CT CHEST W/O CM
2 of 3 series · 15 of 36 positions shown, 18 images · non-contrast
Comparison: Radiograph 12/16/2016, CT abdomen pelvis 08/14/2013

CLINICAL DATA: Cough and shoulder pain heart palpitations

EXAM:
CT CHEST WITHOUT CONTRAST
TECHNIQUE: Multidetector CT imaging of the chest was performed following the
standard protocol without IV contrast.

[Series 2: thorax · axial · 0.71mm/px · z∈[+22,+302]mm · 12 of 166 slices shown, 15 images]
[im 13/166  mediastinal]
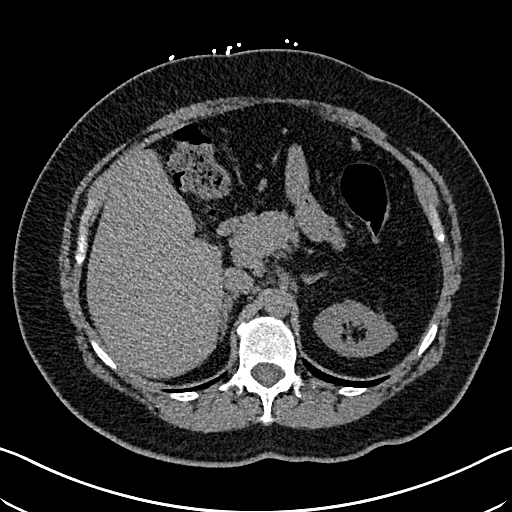
[im 13/166  lung]
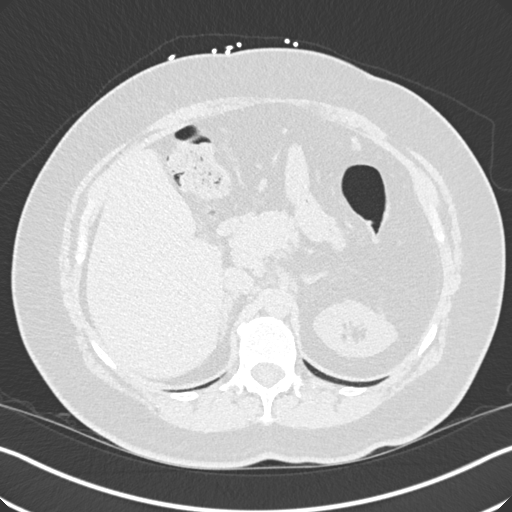
[im 25/166  lung]
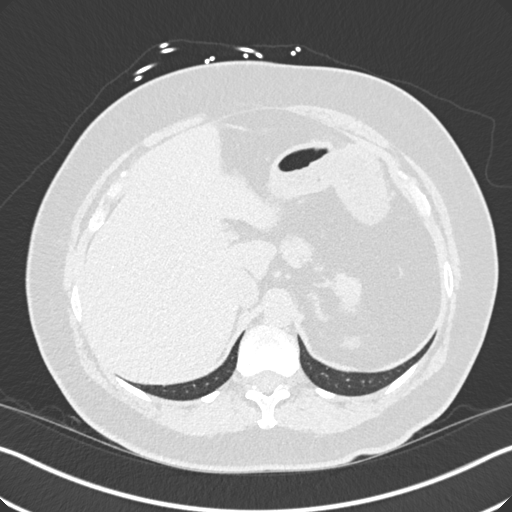
[im 37/166  lung]
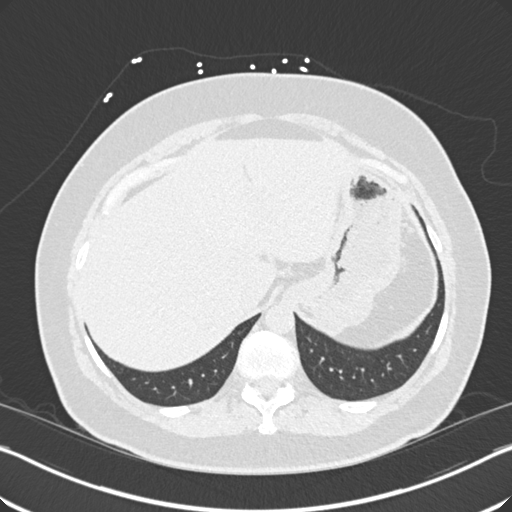
[im 49/166  lung]
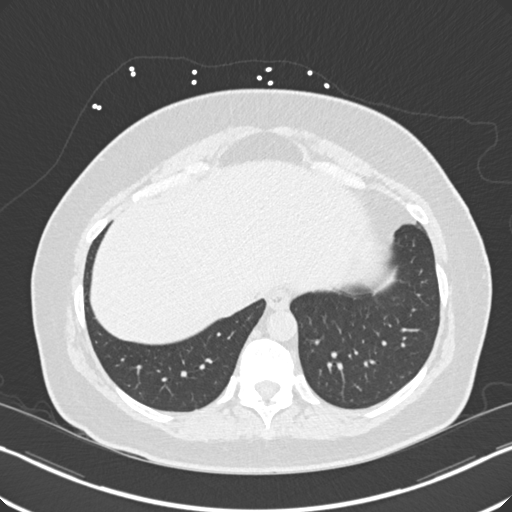
[im 62/166  mediastinal]
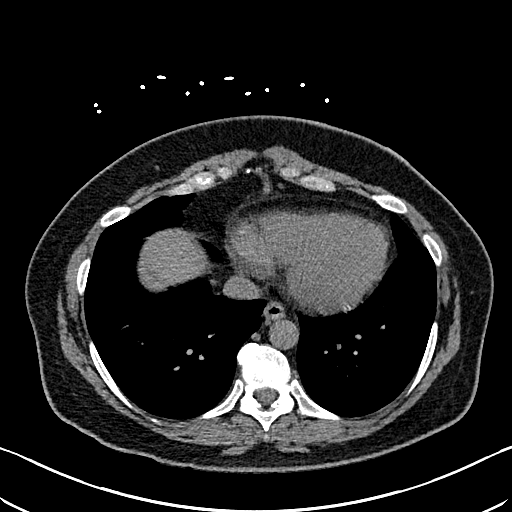
[im 62/166  lung]
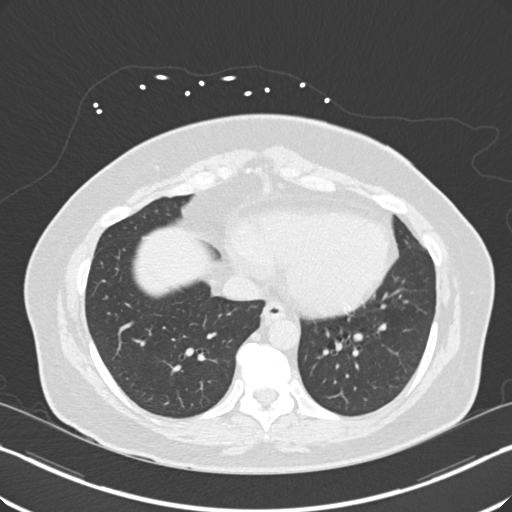
[im 74/166  lung]
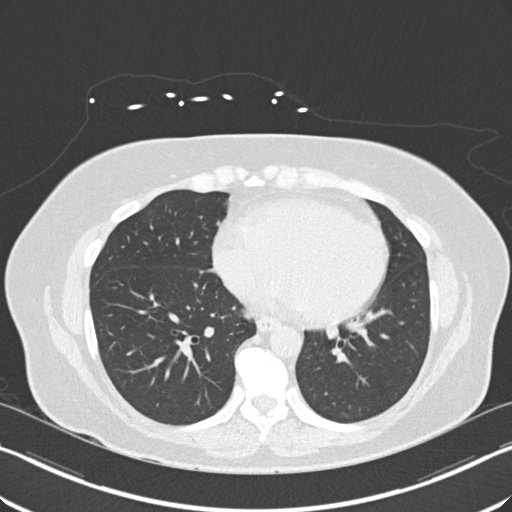
[im 92/166  lung]
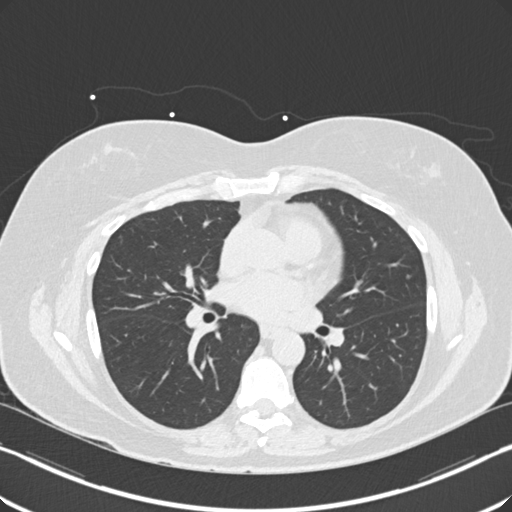
[im 104/166  lung]
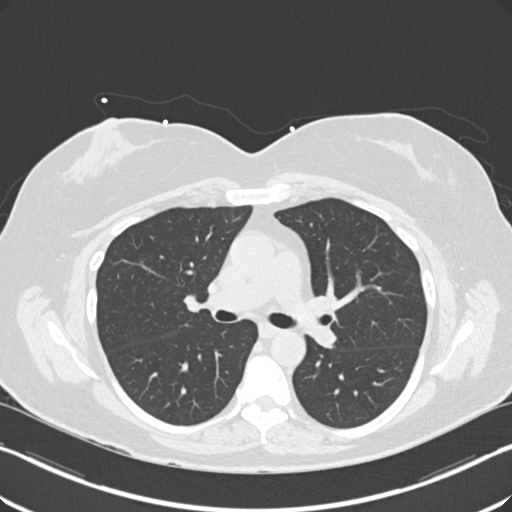
[im 117/166  mediastinal]
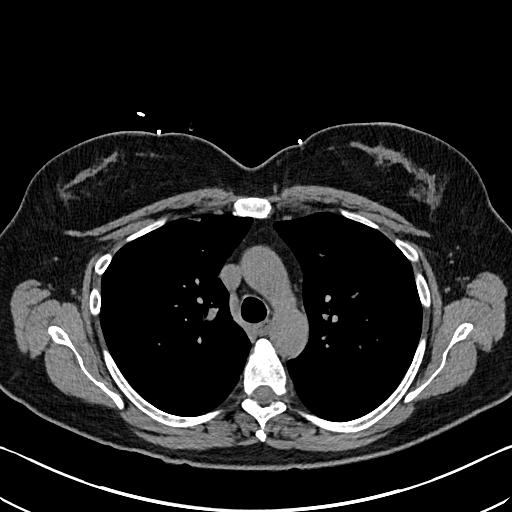
[im 117/166  lung]
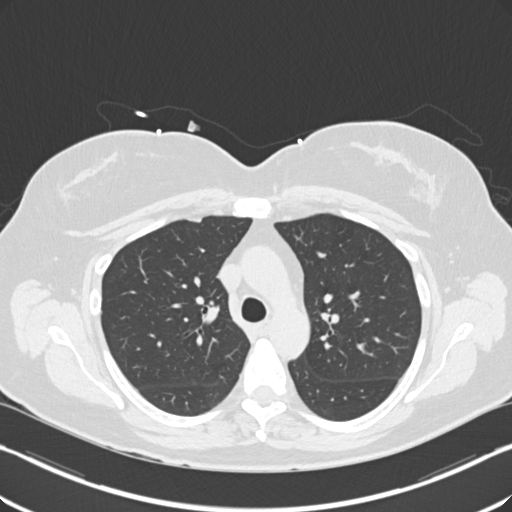
[im 129/166  lung]
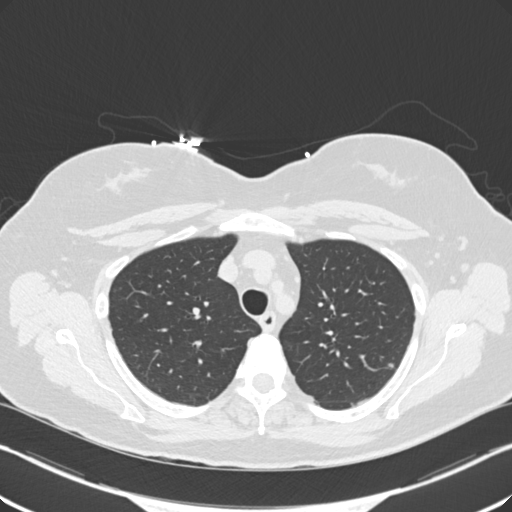
[im 141/166  lung]
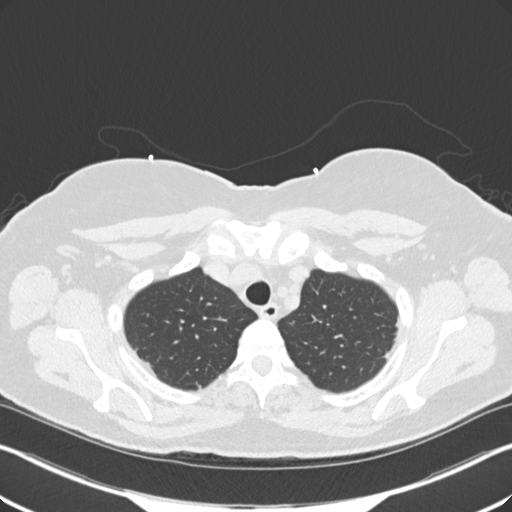
[im 153/166  lung]
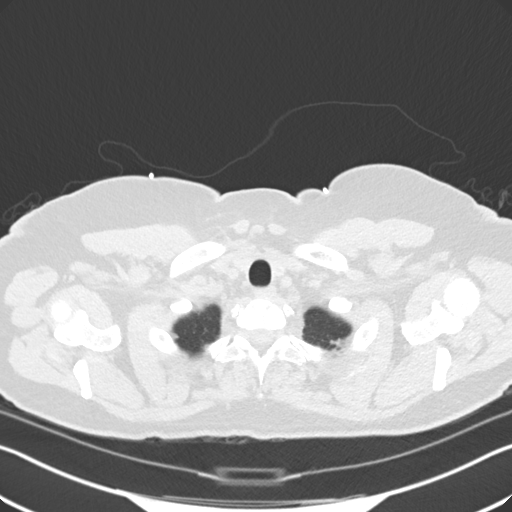

[Series 5: coronal · coronal · 0.65mm/px · 3 of 137 slices shown]
[im 28/137  lung]
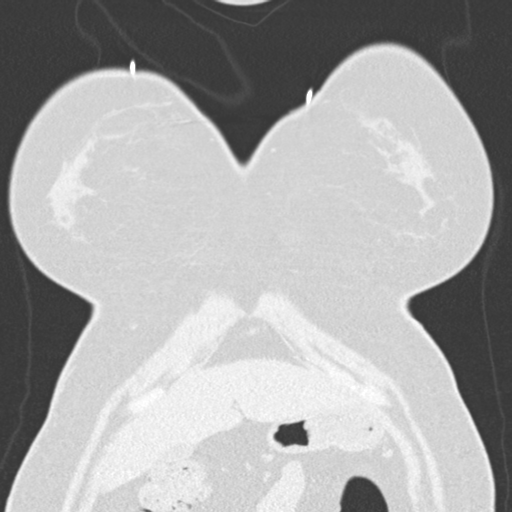
[im 55/137  lung]
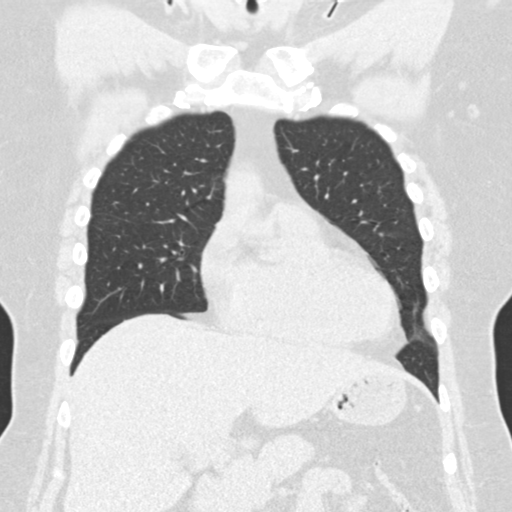
[im 82/137  lung]
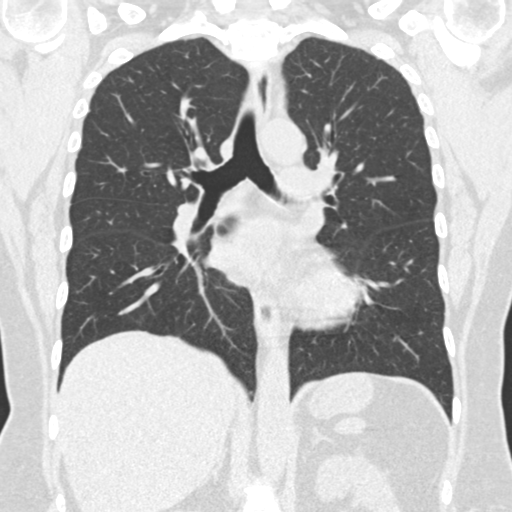

[15 of 36 positions shown; findings below may reference images not displayed]

FINDINGS: Cardiovascular: Limited without intravenous contrast. Non aneurysmal
aorta. Normal heart size. No significant pericardial effusion.

Mediastinum/Nodes: No enlarged mediastinal or axillary lymph nodes.
Thyroid gland, trachea, and esophagus demonstrate no significant
findings.

Lungs/Pleura: No acute infiltrate or effusion. Stable 5 mm right
lower lobe pulmonary nodule. 2 mm subpleural right upper lobe
pulmonary nodule. 4 mm subpleural left upper lobe pulmonary nodule.

Upper Abdomen: No acute abnormality in the upper abdomen.

Musculoskeletal: Degenerative changes. No acute or suspicious bone
lesion.
IMPRESSION: 1. No acute abnormalities are visualized. Lung fields show no acute
infiltrates, pleural effusion or pneumothorax
2. Scattered pulmonary nodules. No follow-up needed if patient is
low-risk (and has no known or suspected primary neoplasm).
Non-contrast chest CT can be considered in 12 months if patient is
high-risk. This recommendation follows the consensus statement:
Guidelines for Management of Incidental Pulmonary Nodules Detected

## 2018-08-03 IMAGING — CR DG CHEST 2V
2 series · 2 of 2 positions shown · non-contrast
Comparison: 08/07/2013

CLINICAL DATA: Chest back and shoulder pain with heart palpitations

EXAM:
CHEST  2 VIEW

[chest pa]
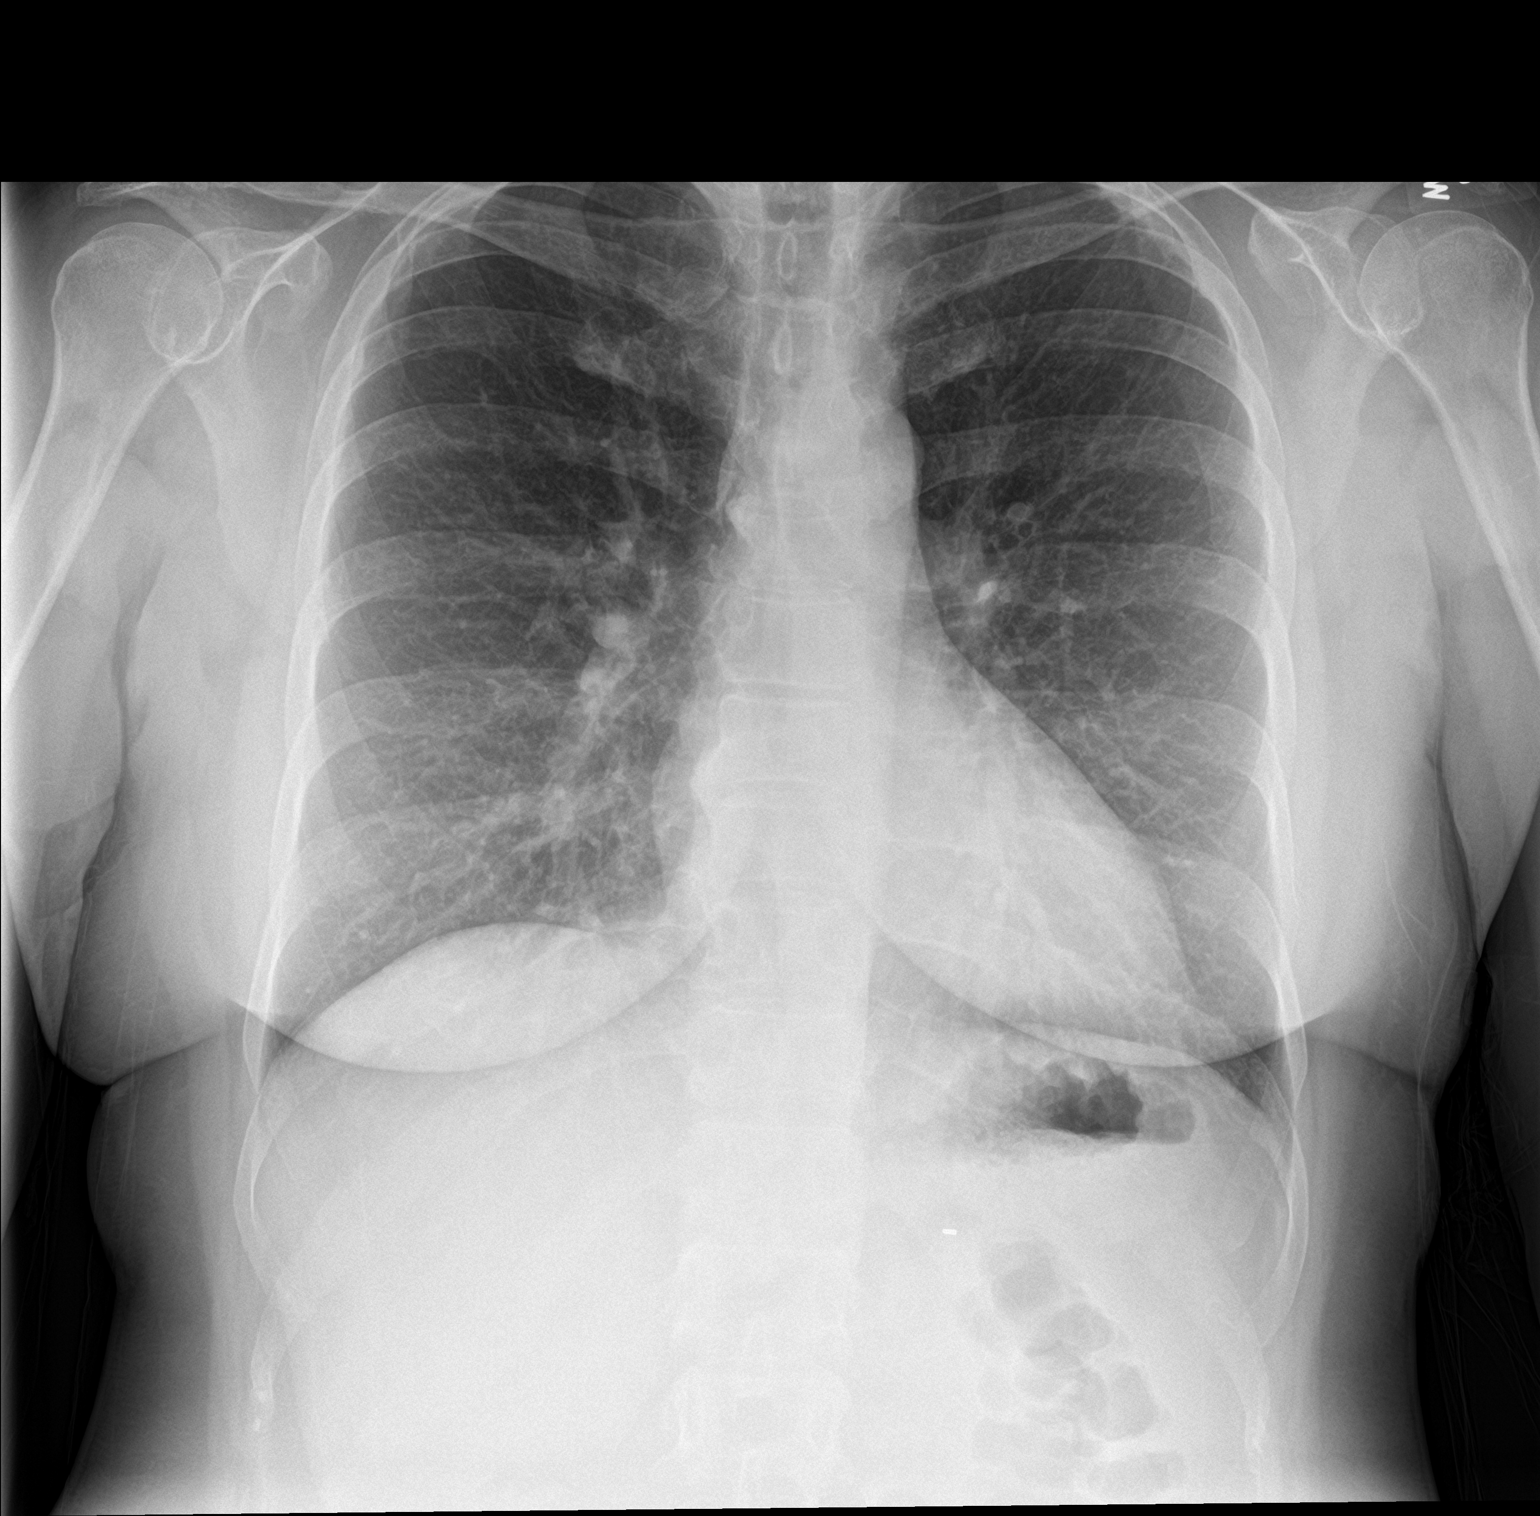

[chest lat]
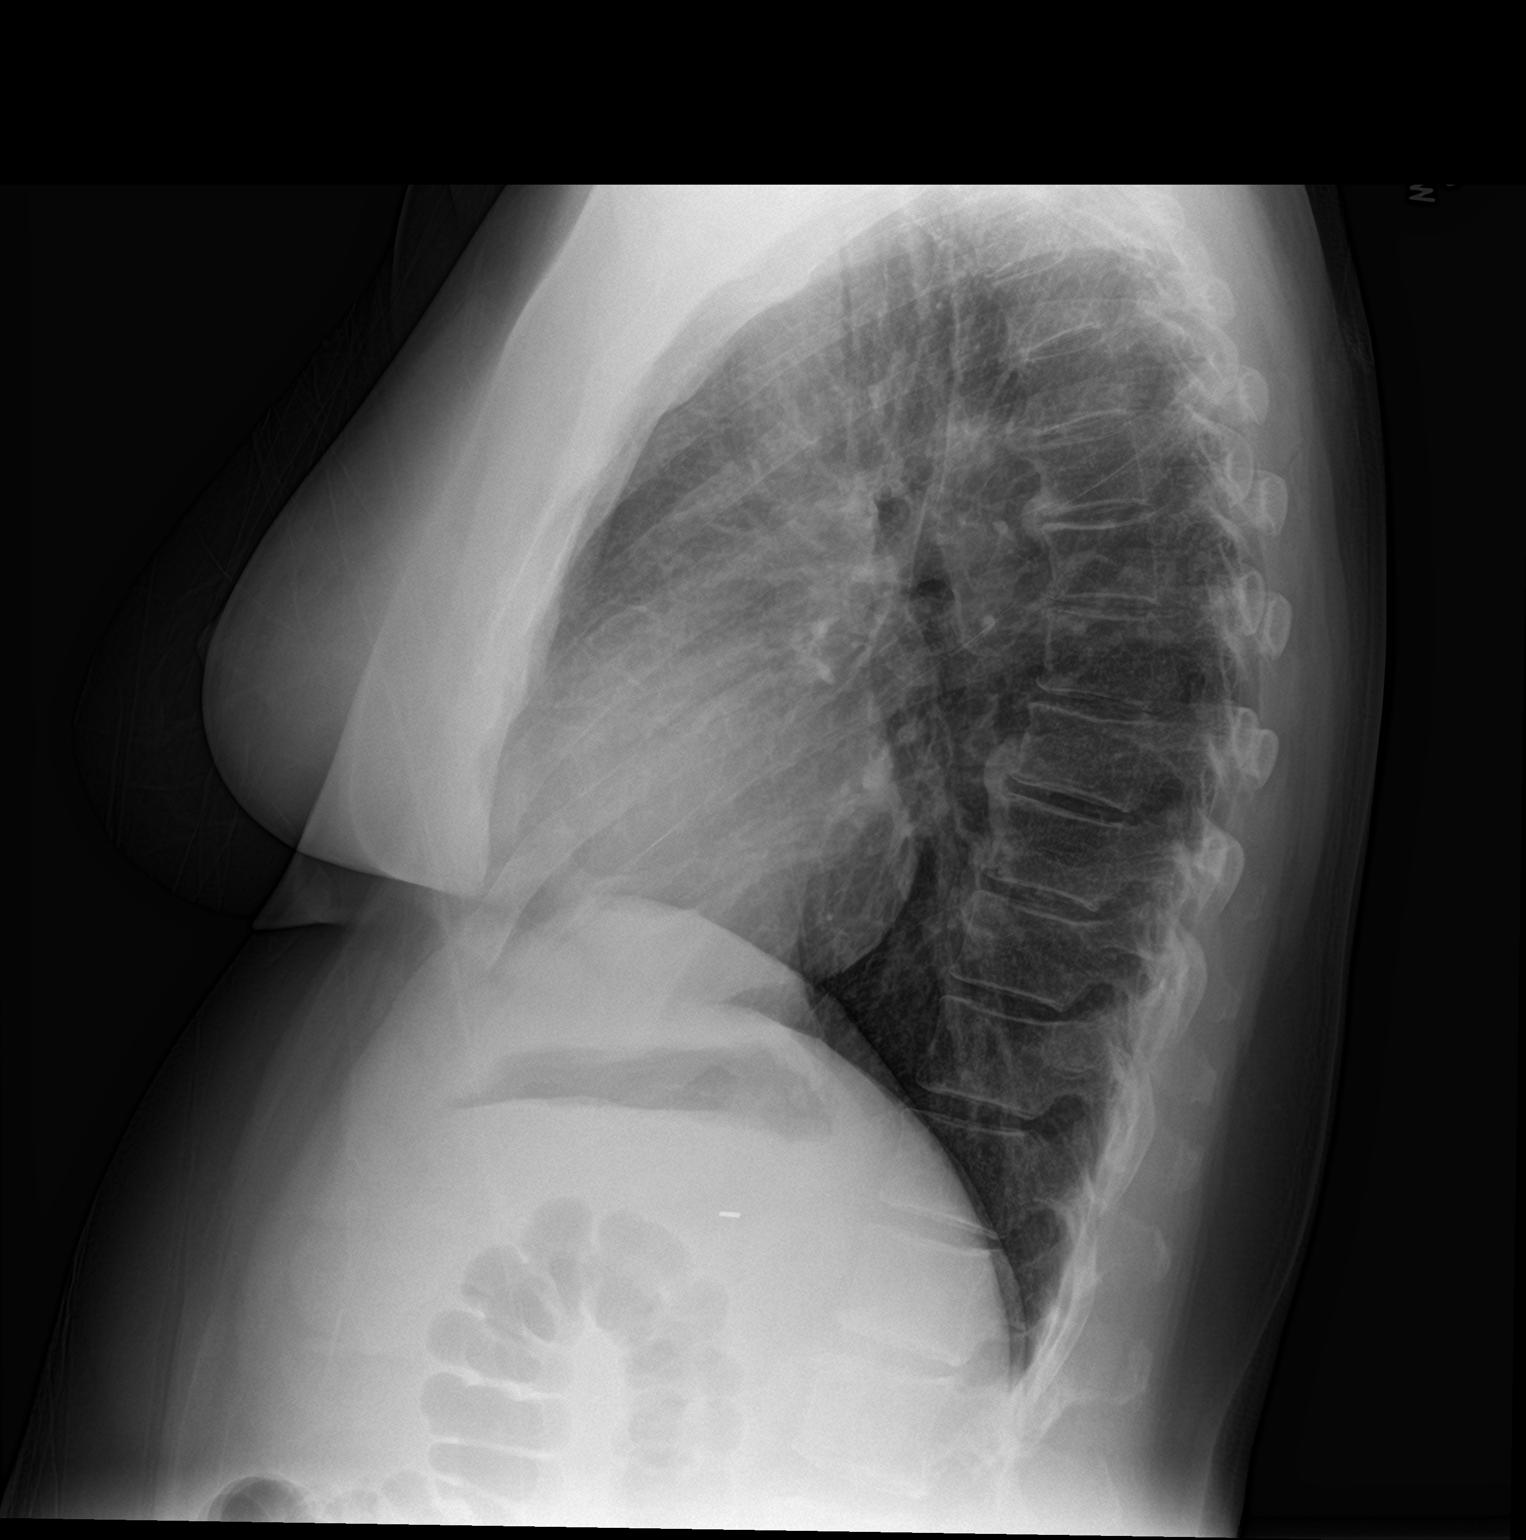

[2 of 2 positions shown; findings below may reference images not displayed]

FINDINGS: The heart size and mediastinal contours are within normal limits.
Both lungs are clear. Degenerative changes of the spine. Surgical
clip in the left upper quadrant as before.
IMPRESSION: No active cardiopulmonary disease.

## 2018-08-07 DIAGNOSIS — G4733 Obstructive sleep apnea (adult) (pediatric): Secondary | ICD-10-CM | POA: Diagnosis not present

## 2018-08-15 DIAGNOSIS — M9901 Segmental and somatic dysfunction of cervical region: Secondary | ICD-10-CM | POA: Diagnosis not present

## 2018-08-15 DIAGNOSIS — M62830 Muscle spasm of back: Secondary | ICD-10-CM | POA: Diagnosis not present

## 2018-08-15 DIAGNOSIS — M9903 Segmental and somatic dysfunction of lumbar region: Secondary | ICD-10-CM | POA: Diagnosis not present

## 2018-08-28 DIAGNOSIS — M9901 Segmental and somatic dysfunction of cervical region: Secondary | ICD-10-CM | POA: Diagnosis not present

## 2018-08-28 DIAGNOSIS — M62830 Muscle spasm of back: Secondary | ICD-10-CM | POA: Diagnosis not present

## 2018-08-28 DIAGNOSIS — M9903 Segmental and somatic dysfunction of lumbar region: Secondary | ICD-10-CM | POA: Diagnosis not present

## 2018-08-29 ENCOUNTER — Other Ambulatory Visit: Payer: Self-pay | Admitting: Family Medicine

## 2018-08-29 DIAGNOSIS — Z1211 Encounter for screening for malignant neoplasm of colon: Secondary | ICD-10-CM | POA: Diagnosis not present

## 2018-08-29 DIAGNOSIS — Z1231 Encounter for screening mammogram for malignant neoplasm of breast: Secondary | ICD-10-CM

## 2018-08-29 DIAGNOSIS — E039 Hypothyroidism, unspecified: Secondary | ICD-10-CM | POA: Diagnosis not present

## 2018-09-04 DIAGNOSIS — H524 Presbyopia: Secondary | ICD-10-CM | POA: Diagnosis not present

## 2018-09-12 DIAGNOSIS — M9901 Segmental and somatic dysfunction of cervical region: Secondary | ICD-10-CM | POA: Diagnosis not present

## 2018-09-12 DIAGNOSIS — G4733 Obstructive sleep apnea (adult) (pediatric): Secondary | ICD-10-CM | POA: Diagnosis not present

## 2018-09-12 DIAGNOSIS — M62830 Muscle spasm of back: Secondary | ICD-10-CM | POA: Diagnosis not present

## 2018-09-12 DIAGNOSIS — M9903 Segmental and somatic dysfunction of lumbar region: Secondary | ICD-10-CM | POA: Diagnosis not present

## 2018-09-26 ENCOUNTER — Ambulatory Visit
Admission: RE | Admit: 2018-09-26 | Discharge: 2018-09-26 | Disposition: A | Discharge status: Home / Self Care | Payer: 59 | Source: Ambulatory Visit | Attending: Family Medicine | Admitting: Family Medicine

## 2018-09-26 DIAGNOSIS — Z1231 Encounter for screening mammogram for malignant neoplasm of breast: Secondary | ICD-10-CM | POA: Diagnosis not present

## 2018-09-26 DIAGNOSIS — M62830 Muscle spasm of back: Secondary | ICD-10-CM | POA: Diagnosis not present

## 2018-09-26 DIAGNOSIS — M9903 Segmental and somatic dysfunction of lumbar region: Secondary | ICD-10-CM | POA: Diagnosis not present

## 2018-09-26 DIAGNOSIS — M9901 Segmental and somatic dysfunction of cervical region: Secondary | ICD-10-CM | POA: Diagnosis not present

## 2018-09-27 DIAGNOSIS — G4733 Obstructive sleep apnea (adult) (pediatric): Secondary | ICD-10-CM | POA: Diagnosis not present

## 2018-09-28 DIAGNOSIS — M9901 Segmental and somatic dysfunction of cervical region: Secondary | ICD-10-CM | POA: Diagnosis not present

## 2018-09-28 DIAGNOSIS — M9903 Segmental and somatic dysfunction of lumbar region: Secondary | ICD-10-CM | POA: Diagnosis not present

## 2018-09-28 DIAGNOSIS — M62830 Muscle spasm of back: Secondary | ICD-10-CM | POA: Diagnosis not present

## 2018-10-02 DIAGNOSIS — E538 Deficiency of other specified B group vitamins: Secondary | ICD-10-CM | POA: Diagnosis not present

## 2018-10-09 DIAGNOSIS — G4733 Obstructive sleep apnea (adult) (pediatric): Secondary | ICD-10-CM | POA: Diagnosis not present

## 2018-10-10 DIAGNOSIS — G4733 Obstructive sleep apnea (adult) (pediatric): Secondary | ICD-10-CM | POA: Diagnosis not present

## 2018-10-13 DIAGNOSIS — M4716 Other spondylosis with myelopathy, lumbar region: Secondary | ICD-10-CM | POA: Diagnosis not present

## 2018-10-16 DIAGNOSIS — M9901 Segmental and somatic dysfunction of cervical region: Secondary | ICD-10-CM | POA: Diagnosis not present

## 2018-10-16 DIAGNOSIS — M9903 Segmental and somatic dysfunction of lumbar region: Secondary | ICD-10-CM | POA: Diagnosis not present

## 2018-10-16 DIAGNOSIS — M62830 Muscle spasm of back: Secondary | ICD-10-CM | POA: Diagnosis not present

## 2018-10-18 DIAGNOSIS — M9903 Segmental and somatic dysfunction of lumbar region: Secondary | ICD-10-CM | POA: Diagnosis not present

## 2018-10-18 DIAGNOSIS — M62830 Muscle spasm of back: Secondary | ICD-10-CM | POA: Diagnosis not present

## 2018-10-18 DIAGNOSIS — M9901 Segmental and somatic dysfunction of cervical region: Secondary | ICD-10-CM | POA: Diagnosis not present

## 2018-10-19 DIAGNOSIS — M9903 Segmental and somatic dysfunction of lumbar region: Secondary | ICD-10-CM | POA: Diagnosis not present

## 2018-10-19 DIAGNOSIS — M9901 Segmental and somatic dysfunction of cervical region: Secondary | ICD-10-CM | POA: Diagnosis not present

## 2018-10-19 DIAGNOSIS — M62830 Muscle spasm of back: Secondary | ICD-10-CM | POA: Diagnosis not present

## 2018-10-23 DIAGNOSIS — M9901 Segmental and somatic dysfunction of cervical region: Secondary | ICD-10-CM | POA: Diagnosis not present

## 2018-10-23 DIAGNOSIS — M9903 Segmental and somatic dysfunction of lumbar region: Secondary | ICD-10-CM | POA: Diagnosis not present

## 2018-10-23 DIAGNOSIS — M62830 Muscle spasm of back: Secondary | ICD-10-CM | POA: Diagnosis not present

## 2018-11-03 DIAGNOSIS — M62830 Muscle spasm of back: Secondary | ICD-10-CM | POA: Diagnosis not present

## 2018-11-03 DIAGNOSIS — M9903 Segmental and somatic dysfunction of lumbar region: Secondary | ICD-10-CM | POA: Diagnosis not present

## 2018-11-03 DIAGNOSIS — M9901 Segmental and somatic dysfunction of cervical region: Secondary | ICD-10-CM | POA: Diagnosis not present

## 2018-11-03 DIAGNOSIS — M4716 Other spondylosis with myelopathy, lumbar region: Secondary | ICD-10-CM | POA: Diagnosis not present

## 2018-11-07 DIAGNOSIS — M9901 Segmental and somatic dysfunction of cervical region: Secondary | ICD-10-CM | POA: Diagnosis not present

## 2018-11-07 DIAGNOSIS — M62830 Muscle spasm of back: Secondary | ICD-10-CM | POA: Diagnosis not present

## 2018-11-07 DIAGNOSIS — M9903 Segmental and somatic dysfunction of lumbar region: Secondary | ICD-10-CM | POA: Diagnosis not present

## 2018-11-07 DIAGNOSIS — E538 Deficiency of other specified B group vitamins: Secondary | ICD-10-CM | POA: Diagnosis not present

## 2018-11-08 DIAGNOSIS — M62830 Muscle spasm of back: Secondary | ICD-10-CM | POA: Diagnosis not present

## 2018-11-08 DIAGNOSIS — M9901 Segmental and somatic dysfunction of cervical region: Secondary | ICD-10-CM | POA: Diagnosis not present

## 2018-11-08 DIAGNOSIS — M9903 Segmental and somatic dysfunction of lumbar region: Secondary | ICD-10-CM | POA: Diagnosis not present

## 2018-11-16 DIAGNOSIS — T781XXA Other adverse food reactions, not elsewhere classified, initial encounter: Secondary | ICD-10-CM | POA: Diagnosis not present

## 2018-11-17 DIAGNOSIS — R6889 Other general symptoms and signs: Secondary | ICD-10-CM | POA: Diagnosis not present

## 2018-11-17 DIAGNOSIS — J069 Acute upper respiratory infection, unspecified: Secondary | ICD-10-CM | POA: Diagnosis not present

## 2018-11-20 DIAGNOSIS — M4716 Other spondylosis with myelopathy, lumbar region: Secondary | ICD-10-CM | POA: Diagnosis not present

## 2018-11-20 DIAGNOSIS — J305 Allergic rhinitis due to food: Secondary | ICD-10-CM | POA: Diagnosis not present

## 2018-11-21 DIAGNOSIS — H1033 Unspecified acute conjunctivitis, bilateral: Secondary | ICD-10-CM | POA: Diagnosis not present

## 2018-11-22 DIAGNOSIS — H7291 Unspecified perforation of tympanic membrane, right ear: Secondary | ICD-10-CM | POA: Diagnosis not present

## 2018-11-22 DIAGNOSIS — H6691 Otitis media, unspecified, right ear: Secondary | ICD-10-CM | POA: Diagnosis not present

## 2018-11-27 DIAGNOSIS — M9903 Segmental and somatic dysfunction of lumbar region: Secondary | ICD-10-CM | POA: Diagnosis not present

## 2018-11-27 DIAGNOSIS — M9901 Segmental and somatic dysfunction of cervical region: Secondary | ICD-10-CM | POA: Diagnosis not present

## 2018-11-27 DIAGNOSIS — M62830 Muscle spasm of back: Secondary | ICD-10-CM | POA: Diagnosis not present

## 2018-11-27 DIAGNOSIS — H6500 Acute serous otitis media, unspecified ear: Secondary | ICD-10-CM | POA: Diagnosis not present

## 2018-11-27 DIAGNOSIS — M5416 Radiculopathy, lumbar region: Secondary | ICD-10-CM | POA: Diagnosis not present

## 2018-11-27 DIAGNOSIS — J069 Acute upper respiratory infection, unspecified: Secondary | ICD-10-CM | POA: Diagnosis not present

## 2018-11-27 DIAGNOSIS — H6980 Other specified disorders of Eustachian tube, unspecified ear: Secondary | ICD-10-CM | POA: Diagnosis not present

## 2018-12-01 DIAGNOSIS — M6281 Muscle weakness (generalized): Secondary | ICD-10-CM | POA: Diagnosis not present

## 2018-12-01 DIAGNOSIS — M545 Low back pain: Secondary | ICD-10-CM | POA: Diagnosis not present

## 2018-12-05 DIAGNOSIS — M545 Low back pain: Secondary | ICD-10-CM | POA: Diagnosis not present

## 2018-12-05 DIAGNOSIS — M6281 Muscle weakness (generalized): Secondary | ICD-10-CM | POA: Diagnosis not present

## 2018-12-07 DIAGNOSIS — M545 Low back pain: Secondary | ICD-10-CM | POA: Diagnosis not present

## 2018-12-07 DIAGNOSIS — M6281 Muscle weakness (generalized): Secondary | ICD-10-CM | POA: Diagnosis not present

## 2018-12-08 DIAGNOSIS — E538 Deficiency of other specified B group vitamins: Secondary | ICD-10-CM | POA: Diagnosis not present

## 2018-12-11 DIAGNOSIS — M9903 Segmental and somatic dysfunction of lumbar region: Secondary | ICD-10-CM | POA: Diagnosis not present

## 2018-12-11 DIAGNOSIS — M62830 Muscle spasm of back: Secondary | ICD-10-CM | POA: Diagnosis not present

## 2018-12-11 DIAGNOSIS — M9901 Segmental and somatic dysfunction of cervical region: Secondary | ICD-10-CM | POA: Diagnosis not present

## 2018-12-12 DIAGNOSIS — M6281 Muscle weakness (generalized): Secondary | ICD-10-CM | POA: Diagnosis not present

## 2018-12-12 DIAGNOSIS — M545 Low back pain: Secondary | ICD-10-CM | POA: Diagnosis not present

## 2018-12-14 DIAGNOSIS — H6691 Otitis media, unspecified, right ear: Secondary | ICD-10-CM | POA: Diagnosis not present

## 2018-12-14 DIAGNOSIS — R05 Cough: Secondary | ICD-10-CM | POA: Diagnosis not present

## 2018-12-14 DIAGNOSIS — J019 Acute sinusitis, unspecified: Secondary | ICD-10-CM | POA: Diagnosis not present

## 2018-12-15 DIAGNOSIS — M545 Low back pain: Secondary | ICD-10-CM | POA: Diagnosis not present

## 2018-12-15 DIAGNOSIS — M6281 Muscle weakness (generalized): Secondary | ICD-10-CM | POA: Diagnosis not present

## 2018-12-19 DIAGNOSIS — M545 Low back pain: Secondary | ICD-10-CM | POA: Diagnosis not present

## 2018-12-19 DIAGNOSIS — M6281 Muscle weakness (generalized): Secondary | ICD-10-CM | POA: Diagnosis not present

## 2018-12-21 DIAGNOSIS — M6281 Muscle weakness (generalized): Secondary | ICD-10-CM | POA: Diagnosis not present

## 2018-12-21 DIAGNOSIS — M545 Low back pain: Secondary | ICD-10-CM | POA: Diagnosis not present

## 2018-12-25 DIAGNOSIS — M9901 Segmental and somatic dysfunction of cervical region: Secondary | ICD-10-CM | POA: Diagnosis not present

## 2018-12-25 DIAGNOSIS — M62830 Muscle spasm of back: Secondary | ICD-10-CM | POA: Diagnosis not present

## 2018-12-25 DIAGNOSIS — H6981 Other specified disorders of Eustachian tube, right ear: Secondary | ICD-10-CM | POA: Diagnosis not present

## 2018-12-25 DIAGNOSIS — M9903 Segmental and somatic dysfunction of lumbar region: Secondary | ICD-10-CM | POA: Diagnosis not present

## 2018-12-27 DIAGNOSIS — M6281 Muscle weakness (generalized): Secondary | ICD-10-CM | POA: Diagnosis not present

## 2018-12-27 DIAGNOSIS — M545 Low back pain: Secondary | ICD-10-CM | POA: Diagnosis not present

## 2018-12-28 DIAGNOSIS — G4733 Obstructive sleep apnea (adult) (pediatric): Secondary | ICD-10-CM | POA: Diagnosis not present

## 2018-12-29 DIAGNOSIS — M545 Low back pain: Secondary | ICD-10-CM | POA: Diagnosis not present

## 2018-12-29 DIAGNOSIS — M6281 Muscle weakness (generalized): Secondary | ICD-10-CM | POA: Diagnosis not present

## 2019-01-03 DIAGNOSIS — M6281 Muscle weakness (generalized): Secondary | ICD-10-CM | POA: Diagnosis not present

## 2019-01-03 DIAGNOSIS — M545 Low back pain: Secondary | ICD-10-CM | POA: Diagnosis not present

## 2019-01-05 DIAGNOSIS — M545 Low back pain: Secondary | ICD-10-CM | POA: Diagnosis not present

## 2019-01-05 DIAGNOSIS — M6281 Muscle weakness (generalized): Secondary | ICD-10-CM | POA: Diagnosis not present

## 2019-02-22 ENCOUNTER — Other Ambulatory Visit: Payer: Self-pay | Admitting: Family Medicine

## 2019-02-22 DIAGNOSIS — R1011 Right upper quadrant pain: Secondary | ICD-10-CM

## 2019-03-06 ENCOUNTER — Other Ambulatory Visit: Payer: Self-pay

## 2019-03-06 ENCOUNTER — Encounter
Admission: RE | Admit: 2019-03-06 | Discharge: 2019-03-06 | Disposition: A | Discharge status: Home / Self Care | Payer: 59 | Source: Ambulatory Visit | Attending: Family Medicine | Admitting: Family Medicine

## 2019-03-06 DIAGNOSIS — R1011 Right upper quadrant pain: Secondary | ICD-10-CM | POA: Diagnosis present

## 2019-03-06 MED ORDER — TECHNETIUM TC 99M MEBROFENIN IV KIT
5.0000 | PACK | Freq: Once | INTRAVENOUS | Status: AC | PRN
  Administered 2019-03-06: 11:00:00 5.23 via INTRAVENOUS

## 2019-05-30 DIAGNOSIS — I440 Atrioventricular block, first degree: Secondary | ICD-10-CM | POA: Insufficient documentation

## 2019-05-30 DIAGNOSIS — R030 Elevated blood-pressure reading, without diagnosis of hypertension: Secondary | ICD-10-CM | POA: Insufficient documentation

## 2019-07-12 ENCOUNTER — Other Ambulatory Visit: Payer: Self-pay | Admitting: Student

## 2019-07-12 DIAGNOSIS — K760 Fatty (change of) liver, not elsewhere classified: Secondary | ICD-10-CM | POA: Insufficient documentation

## 2019-07-12 DIAGNOSIS — G8929 Other chronic pain: Secondary | ICD-10-CM

## 2019-07-12 DIAGNOSIS — R109 Unspecified abdominal pain: Secondary | ICD-10-CM

## 2019-07-12 DIAGNOSIS — Z87898 Personal history of other specified conditions: Secondary | ICD-10-CM

## 2019-07-19 ENCOUNTER — Ambulatory Visit: Admission: RE | Admit: 2019-07-19 | Payer: 59 | Source: Ambulatory Visit

## 2019-07-20 ENCOUNTER — Ambulatory Visit
Admission: RE | Admit: 2019-07-20 | Discharge: 2019-07-20 | Disposition: A | Discharge status: Home / Self Care | Payer: 59 | Source: Ambulatory Visit | Attending: Student | Admitting: Student

## 2019-07-20 ENCOUNTER — Other Ambulatory Visit: Payer: Self-pay

## 2019-07-20 DIAGNOSIS — R109 Unspecified abdominal pain: Secondary | ICD-10-CM | POA: Diagnosis not present

## 2019-07-20 DIAGNOSIS — G8929 Other chronic pain: Secondary | ICD-10-CM | POA: Diagnosis present

## 2019-07-20 DIAGNOSIS — Z87898 Personal history of other specified conditions: Secondary | ICD-10-CM | POA: Insufficient documentation

## 2019-08-13 DIAGNOSIS — Z85828 Personal history of other malignant neoplasm of skin: Secondary | ICD-10-CM | POA: Insufficient documentation

## 2019-08-24 ENCOUNTER — Other Ambulatory Visit: Payer: Self-pay | Admitting: Family Medicine

## 2019-08-24 DIAGNOSIS — Z1231 Encounter for screening mammogram for malignant neoplasm of breast: Secondary | ICD-10-CM

## 2019-09-05 ENCOUNTER — Encounter: Payer: Self-pay | Admitting: Physical Therapy

## 2019-09-05 ENCOUNTER — Other Ambulatory Visit: Payer: Self-pay

## 2019-09-05 ENCOUNTER — Ambulatory Visit: Payer: 59 | Attending: Family Medicine | Admitting: Physical Therapy

## 2019-09-05 DIAGNOSIS — M6281 Muscle weakness (generalized): Secondary | ICD-10-CM | POA: Diagnosis present

## 2019-09-05 DIAGNOSIS — M546 Pain in thoracic spine: Secondary | ICD-10-CM | POA: Insufficient documentation

## 2019-09-05 DIAGNOSIS — M62838 Other muscle spasm: Secondary | ICD-10-CM | POA: Insufficient documentation

## 2019-09-05 NOTE — Therapy (Signed)
[facility] [address], [place], [address] Phone: [phone]   Fax:  [phone]  Physical Therapy Evaluation  Patient Details  Name: Sophia [name] MRN: [id number] Date of Birth: [birth year] Referring Provider (PT): Meeler, Alphonzo Lemmings, NP   Encounter Date: 09/05/2019  PT End of Session - 09/05/19 1928    Visit Number  1    Number of Visits  12    Date for PT Re-Evaluation  10/17/19    Authorization Type  [employer] reporting period from 09/05/2019    Authorization - Visit Number  1    Authorization - Number of Visits  10    PT Start Time  1435    PT Stop Time  1600    PT Time Calculation (min)  85 min    Activity Tolerance  Patient tolerated treatment well;Patient limited by pain    Behavior During Therapy  [facility] for tasks assessed/performed       Past Medical History:  Diagnosis Date  . Allergic rhinitis   . Asthma, mild intermittent   . Mitral valve prolapse   . Thyroid disease     Past Surgical History:  Procedure Laterality Date  . SHOULDER SURGERY  1990s   Left  . SPLENECTOMY  1978  . TOE SURGERY      There were no vitals filed for this visit.   Subjective Assessment - 09/05/19 1502    Subjective  See body of note for details of subjective exam.    Pertinent History  Patient is a 58 y.o. female who presents to outpatient physical therapy with a referral for medical diagnosis chronic right flank pain. This patient's chief complaints consist of gradually worsening over the last 6 years, idiopathic onset,  intermittent stabbing/contant aching pain at R lateral trunk near ribs 8-10 that occasionally radiates anteriorly or towards spine and irritated with movement of this region in ipsilateral sidebending, rotation, or spine extension leading to the following functional deficits: any activity that requires moving the trunk in rotation, sidebending, extension including unable to do yoga (legs up the  wall), laying on the back, raising head and shoulders up from lying on back, wakes her up at night, playing golf (able to play if takes deep breath and holds on back swing and until follow through is over). Continues to have gas when she does twisting and touching there, not exercising like she wants to, cannot do all lower back exercises for her low back pain, bicycles. She can play 1 day of golf and takes 2 days to recover. Used to play 4 x per week without difficulty. Relevant past medical history and comorbidities include cholecystectomy 05/23/2019, asthma, chronic low back pain with most recent flair up the most severe she has had including L leg numbness to foot, B12 deficiency, splenectomy, L great toe surgery, Left shoulder surgery, hypothyroidism, possible mitral valve prolapse, heredity spherocytosis, anemia.    Limitations  Sitting    How long can you sit comfortably?  sitting okay    How long can you stand comfortably?  limited by low back: 30 min    How long can you walk comfortably?  able to walk 30-40 min    Diagnostic tests  Multiple abdominal CT scans, colonoscopy, endoscopy, radiographs negative. Has not had Thoracic Spine MRI.    Patient Stated Goals  "make the pain go away" and be able to play golf, exercise, do yoga, all that stuff that she was doing before  that she could do before it was constant pain. Golf was the main thing she had to give up.    Currently in Pain?  Yes    Pain Score  2    Best: 0/10; Worst: 10/10   Pain Location  Back   R lateral justcaudal to bra strap, occasionally comes forward right under breast; occasitnally moves to back when she moves on her back   Pain Orientation  Right    Pain Descriptors / Indicators  Jabbing;Stabbing   like being stabbed with a hot knife, sometimes irritating pain, like there is a moving rock moving on the ribs   Pain Type  Chronic pain    Pain Radiating Towards  slightly towards under R breast or towards spine.    Pain Onset   More than a month ago    Pain Frequency  Intermittent    Aggravating Factors   laying on back, rotating and bending R, raising up arm, 'moving wrong", moving trunk, moving R arm over head    Pain Relieving Factors  holding breath while swinging during golf, limiting motion,    Effect of Pain on Daily Activities  Functional limitations: uFunctional limitations: difficulty with anything that requires motion of the R side of the trunk, unable to do yoga (legs up the wall), laying on the back, raising head and shoulders up from lying on back, wakes her up at night, playing golf (able to play if takes deep breath and holds on back swing and until follow through is over). Continues to have gas when she does twisting and touching there, not exercising like she wants to, cannot do all lower back exercises for her low back pain, bicycles. She can play 1 day of golf and takes 2 days to recover. Used to play 4 x per week without difficulty. nable to do yoga (legs up the wall), laying on the back, raising head and shoulders up from lying on back, wakes her up at night, playing golf (able to play if takes deep breath and holds on back swing and until follow through is over). Continues to have gas when she does twisting and touching there, not exercising like she wants to, cannot do all lower back exercises for her low back pain, bicycles. She can play 1 day of golf and takes 2 days to recover. Used to play 4 x per week without difficulty.        [facility] PT Assessment - 09/05/19 0001      Assessment   Medical Diagnosis   chronic right flank pain    Referring Provider (PT)  Meeler, Loree Fee, NP    Onset Date/Surgical Date  06/04/13    Hand Dominance  Right    Next MD Visit  January 2021    Prior Therapy  none for this problem prior to this episode of care.       Precautions   Precautions  Other (comment)   none for gallbladder; told not to kettelbells/HIIT for LBP     Restrictions   Weight Bearing Restrictions   No      Balance Screen   Has the patient fallen in the past 6 months  No    Has the patient had a decrease in activity level because of a fear of falling?   No    Is the patient reluctant to leave their home because of a fear of falling?   No      Home Environment   Living Environment  --  no concerns     Prior Function   Level of Independence  Independent    Vocation  Retired    Immunologist, Quarry manager, lots of standing, walking, lifting    Leisure  golf (completative), yoga, working out, traveling, golf trips, beach, mountains      Cognition   Overall Cognitive Status  Within Functional Limits for tasks assessed      Observation/Other Assessments   Observations  see note from 09/05/2019 for latest objecteve data    Focus on Therapeutic Outcomes (FOTO)   deferred to visit 2       OBJECTIVE  OBSERVATION/INSPECTION . Posture: mild forward head, rounded shoulders, slumped in sitting.  . Tremor: none . Muscle bulk: generally overall mildly decreased consistent with lack of regular adequate exercise or physical activity.  . Skin: WFL  . Gait: grossly WFL for household and short community ambulation. More detailed gait analysis deferred to later date as needed.  . Lower rib circumference excursion: 2 cm; no bilateral differences apparent by  Palpation during inhalation and exhalation.   NEUROLOGICAL  Upper Motor Neuron Screen Hoffman's, and Clonus (ankle) negative bilaterally  Dermatomes . C2-T1 appears equal and intact to light touch.  . L2-S2 appears equal and intact to light touch.  Myotomes . C2-T1 appears equal and intact  . L2-S2 appears equal and intact   SPINE MOTION Cervical Spine AROM *Indicates pain Appears WFL except Rotation: R= pain in R thorax  Thoracic AROM *Indicates pain - Flexion: = WFL. - Extension: = limited, mild pain in anterior R rib. - Rotation: R = 20*, L = 40 . - Side Flexion: R = painful, mild limitation, L =  WFL   Lumbar AROM *Indicates pain - Flexion: = WNL, lack of lumbar curve reversal - Extension: = WFL - Rotation: R = WFL but painful, L = WFL - Side Flexion: R = 57cm *, L = 54 cm. (cm = distance to floor) -    PERIPHERAL JOINT MOTION (in degrees)  Comments: BUE = grossly WFL except R thorax pain with R shoulder flexion   MUSCLE PERFORMANCE (MMT):  Comments: B UE and B LE = WFL except R thorax pain with R serratus anterior testing.    SUSTAINED POSITIONS TESTING:   Position Time During  After  Comments  Lying prone in ext. (Prone on elbows)    Discontinued due to lumbar pain.   Comments: discontinued due to lumbar pain.    REPEATED MOTIONS TESTING:  Motion/Technique sets x reps During After ROM Functional test** Stoplight Comments  Seated thoracic spine over back of chair positioned near T9 x10 Increasing pain worse   red   **Functional test:  Comments: required manual guidance.   SPECIAL TESTS: Cervical compression: mild neck pain  ACCESSORY MOTION:  - Hypomobile to CPA throughout thoracic and lumbar spine - Locally tender to CPA in thoracic spine worst T9-10 - Tender to spring testing at R ribs 9 and 10 but does not radiate.  - Globally tender and increasing general pain and irritation near affected area by end of ascessory motion testing. Patient did not have clear radiation from spine to R thorax but mobilizing ribs and spinal segments in this region resulted in increased generalized, not sharp, concordant pain.   PALPATION: - Mildly TTP at right thorax and increased with continued palpation. Did not appear to be surface tender.   FUNCTIONAL MOBILITY: - Bed mobility: prone <> sit independent with pain  - Transfers: sit <>  stand independent with pain  EDUCATION/COGNITION: Patient is alert and oriented X 4.  TREATMENT:   Therapeutic exercise: to centralize symptoms and improve ROM, strength, muscular endurance, and activity tolerance required for successful  completion of functional activities.  - repeated thoracic extension in seated over back of chair with manual guidance x10, increased discomfort so discontinued.  - Education on diagnosis, prognosis, POC, anatomy and physiology of current condition.  - discussed HEP but did not assign due to current irritability of condition following examination.   Manual therapy: to reduce pain and tissue tension, improve range of motion, neuromodulation, in order to promote improved ability to complete functional activities. - CPA to thoracic spine and R ribs 7-12 grade II to decrease pain. Patient reported increased pain. Discontinued.   Therapeutic exercise was performed independently and separately from manual interventions and required specific cuing.   Patient response to treatment:  Pt tolerated examination and treatment fair overall. All interventions attempted to target painful region increased pain, so were discontinued. Patient's condition appears to be quite irritable and was irritated following exam. Plan to introduce gentle interventions at next session. Pt required multimodal cuing for proper technique and to facilitate improved neuromuscular control, strength, range of motion, and functional ability resulting in improved performance and form.     PT Education - 09/05/19 1927    Education Details  Exercise purpose/form. Self management techniques. Education on diagnosis, prognosis, POC, anatomy and physiology of current condition. Did not provide HEP at this time due to irritability of condition.    Person(s) Educated  Patient    Methods  Explanation;Demonstration;Tactile cues;Verbal cues    Comprehension  Returned demonstration;Verbalized understanding;Need further instruction       PT Short Term Goals - 09/05/19 1952      PT SHORT TERM GOAL #1   Title  Be independent with initial home exercise program for self-management of symptoms.    Baseline  Initial HEP to be assigned visit  2(09/05/2019);    Time  2    Period  Weeks    Status  New    Target Date  09/19/19        PT Long Term Goals - 09/05/19 1953      PT LONG TERM GOAL #1   Title  Be independent with a long-term home exercise program for self-management of symptoms.    Baseline  Initial HEP to be established visit 2 (09/05/2019);    Time  6    Period  Weeks    Status  New    Target Date  10/17/19      PT LONG TERM GOAL #2   Title  Reduce pain with functional activities to equal or less than 1/10 to allow patient to complete usual activities including ADLs, IADLs, and social engagement with less difficulty.    Baseline  10/10 (09/05/2019);    Time  6    Period  Weeks    Status  New    Target Date  10/17/19      PT LONG TERM GOAL #3   Title  Have full thoracic AROM with no compensations or increase in pain in all planes except intermittent end range discomfort to allow patient to complete valued activities with less difficulty.    Baseline  pain with R sidebending, R rotation, and extension (09/05/2019);    Time  6    Period  Weeks    Status  New    Target Date  10/17/19  PT LONG TERM GOAL #4   Title  Complete community, work and/or recreational activities without limitation due to current condition.    Baseline  limiting ability to complete her usual ADLs, IADLs, and social participation including anything that requires motion of the R trunk, yoga, laying on the back, sleeping, crunches, competitive golf, exercises prescribed for her low back pain, being active, exercise program (09/05/2019);    Time  6    Period  Weeks    Status  New    Target Date  10/17/19      PT LONG TERM GOAL #5   Title  Demonstrate improved FOTO score by 10 units to demonstrate improvement in overall condition and self-reported functional ability.    Baseline  to be measured visit 2 (09/05/2019)    Time  6    Period  Weeks    Status  New    Target Date  10/17/19        Plan - 09/05/19 1948    Clinical  Impression Statement  Patient is a 58 y.o. female referred to outpatient physical therapy with a medical diagnosis of chronic right flank pain who presents with signs and symptoms consistent with chronic right thoracic spine pain and rib pain and dysfunction. Patient presents with significant pain, ROM, joint stiffness, muscle performance (strength/power/endurance), postural, and activity tolerance impairments that are limiting ability to complete her usual ADLs, IADLs, and social participation including anything that requires motion of the R trunk, yoga, laying on the back, sleeping, crunches, competitive golf, exercises prescribed for her low back pain, being active, exercise program  without difficulty. Patient will benefit from skilled physical therapy intervention to address current body structure impairments and activity limitations to improve function and work towards goals set in current POC in order to return to prior level of function or maximal functional improvement.    Personal Factors and Comorbidities  Age;Comorbidity 3+;Time since onset of injury/illness/exacerbation;Past/Current Experience   long history of lack of identifiable cause and unsuccessful treatment   Comorbidities  Relevant past medical history and comorbidities include cholecystectomy 05/23/2019, asthma, chronic low back pain with most recent flair up the most severe she has had including L leg numbness to foot, B12 deficiency, splenectomy, L great toe surgery, Left shoulder surgery, hypothyroidism, possible mitral valve prolapse, heredity spherocytosis, anemia.    Examination-Activity Limitations  Sit;Transfers;Bed Mobility;Hygiene/Grooming;Sleep;Bend;Lift;Squat;Public relations account executive;Reach Overhead;Other   anything that requires motion of the R trunk, yoga, laying on the back, sleeping, crunches, competitive golf, exercises prescribed for her low back pain, being active, exercise program   Examination-Participation Restrictions   Interpersonal Relationship;Yard Work;Cleaning;Community Activity   golfing, yoga   Stability/Clinical Decision Making  Evolving/Moderate complexity    Clinical Decision Making  Moderate    Rehab Potential  Fair    PT Frequency  2x / week    PT Duration  6 weeks    PT Treatment/Interventions  ADLs/Self Care Home Management;Aquatic Therapy;Cryotherapy;Moist Heat;Therapeutic activities;Therapeutic exercise;Neuromuscular re-education;Patient/family education;Functional mobility training;Manual techniques;Passive range of motion;Dry needling;Spinal Manipulations;Joint Manipulations    PT Next Visit Plan  establish HEP    PT Home Exercise Plan  to establish at 2nd visit    Consulted and Agree with Plan of Care  Patient        Patient will benefit from skilled therapeutic intervention in order to improve the following deficits and impairments:  Difficulty walking, Increased muscle spasms, Decreased range of motion, Impaired perceived functional ability, Decreased activity tolerance, Decreased strength, Impaired flexibility, Impaired UE functional use,  Postural dysfunction, Pain, Decreased endurance, Decreased mobility  Visit Diagnosis: Pain in thoracic spine  Other muscle spasm  Muscle weakness (generalized)     Problem List Patient Active Problem List   Diagnosis Date Noted  . ACUTE SINUSITIS, UNSPECIFIED 10/16/2007  . RASH AND OTHER NONSPECIFIC SKIN ERUPTION 09/26/2007  . GOITER NOS 06/06/2007  . SPHEROCYTOSIS, HEREDITARY 06/06/2007  . MIGRAINE HEADACHE 06/06/2007  . ALLERGIC RHINITIS 06/06/2007  . ASTHMA 06/06/2007  . INTERSTITIAL CYSTITIS 06/06/2007  . LOW BACK PAIN, CHRONIC 06/06/2007  . Personal history of unspecified circulatory disease 06/06/2007    Luretha Murphy. Ilsa Iha, PT, DPT 09/05/19, 8:00 PM  [facility] [address], [place], [address] Phone: [phone]   Fax:  [phone]  Name: AMILIAH CAMPISI MRN: [id number] Date of Birth: 05-11-1961

## 2019-09-10 ENCOUNTER — Other Ambulatory Visit: Payer: Self-pay

## 2019-09-10 ENCOUNTER — Ambulatory Visit: Payer: 59 | Admitting: Physical Therapy

## 2019-09-10 DIAGNOSIS — M62838 Other muscle spasm: Secondary | ICD-10-CM

## 2019-09-10 DIAGNOSIS — M546 Pain in thoracic spine: Secondary | ICD-10-CM | POA: Diagnosis not present

## 2019-09-10 DIAGNOSIS — M6281 Muscle weakness (generalized): Secondary | ICD-10-CM

## 2019-09-10 NOTE — Therapy (Signed)
[facility] [address] [place], [address] Phone: [phone]   Fax:  [phone]  Physical Therapy Treatment  Patient Details  Name: Sophia [name] MRN: [id number] Date of Birth: [birth year] Referring Provider (PT): Meeler, Loree Fee, NP   Encounter Date: 09/10/2019    Past Medical History:  Diagnosis Date  . Allergic rhinitis   . Asthma, mild intermittent   . Mitral valve prolapse   . Thyroid disease     Past Surgical History:  Procedure Laterality Date  . SHOULDER SURGERY  1990s   Left  . SPLENECTOMY  1978  . TOE SURGERY      There were no vitals filed for this visit.  Subjective Assessment - 09/10/19 0947    Subjective  Patient reports she is feeling her normal pain upon arrival and no change in belching. Current 2/10 in the R side and slight increase in pain at the thoracic spine where palpated last session. She had a lot of pain in the evening following her initial evaluation and could not sleep well. By Thursday night she seemed back to her usual level of pain and discomfort. Denies problems with hiccups.    Pertinent History  Patient is a 58 y.o. female who presents to outpatient physical therapy with a referral for medical diagnosis chronic right flank pain. This patient's chief complaints consist of gradually worsening over the last 6 years, idiopathic onset,  intermittent stabbing/contant aching pain at R lateral trunk near ribs 8-10 that occasionally radiates anteriorly or towards spine and irritated with movement of this region in ipsilateral sidebending, rotation, or spine extension leading to the following functional deficits: any activity that requires moving the trunk in rotation, sidebending, extension including unable to do yoga (legs up the wall), laying on the back, raising head and shoulders up from lying on back, wakes her up at night, playing golf (able to play if takes deep breath  and holds on back swing and until follow through is over). Continues to have gas when she does twisting and touching there, not exercising like she wants to, cannot do all lower back exercises for her low back pain, bicycles. She can play 1 day of golf and takes 2 days to recover. Used to play 4 x per week without difficulty. Relevant past medical history and comorbidities include cholecystectomy 05/23/2019, asthma, chronic low back pain with most recent flair up the most severe she has had including L leg numbness to foot, B12 deficiency, splenectomy, L great toe surgery, Left shoulder surgery, hypothyroidism, possible mitral valve prolapse, heredity spherocytosis, anemia.    Limitations  Sitting    How long can you sit comfortably?  sitting okay    How long can you stand comfortably?  limited by low back: 30 min    How long can you walk comfortably?  able to walk 30-40 min    Diagnostic tests  Multiple abdominal CT scans, colonoscopy, endoscopy, radiographs negative. Has not had Thoracic Spine MRI.    Patient Stated Goals  "make the pain go away" and be able to play golf, exercise, do yoga, all that stuff that she was doing before that she could do before it was constant pain. Golf was the main thing she had to give up.    Currently in Pain?  Yes    Pain Score  2     Pain Onset  More than a month ago         TREATMENT:  Therapeutic exercise: to centralize symptoms and improve ROM, strength, muscular endurance, and activity tolerance required for successful completion of functional activities.   - Upper body ergometer with no added resistance encourage joint nutrition, warm tissue, induce analgesic effect of aerobic exercise, improve muscular strength and endurance,  and prepare for remainder of session. 5 min during subjective exam.   - seated lumbothoracic flexion physioball roll out, forward x5. Discontinued due to increased L low back pain.   - educated on centralization and  peripheralization in spinal pain. Patient recognized concept from past PT treatment.   - standing thoracic flexion with physioball on elevated table x 2 min forward, x 4 each direction alternating diagonals. Mildly uncomfortable to R diagonal, worsening as continuing so discontinued. Made belching worse.   - Standing spilt stance rows. 15# x30;  Repeated x30 with blue theraband and added to HEP.   - seated lat pull with isolated shoulder depression. # 25  X30. Reps.   -  Standing single arm row at cable; 10# 2x26 each side Limited by R sided pain. Increased belching  - standing side bending to L with 5# dumbbell in R UE. x10 but started to hurt on R.   - Single arm carry 5# dumbbell in R hand while ambulating 200 feet. For isometric R trunk contraction. Noted for increased pain near T9 in thoracic spine.   - Education on HEP including handout   Cuing throughout for improved form and appropriate actions based on pain response.   HOME EXERCISE PROGRAM Access Code: [email or web address]  URL: [email or web address]  Date: 09/10/2019  Prepared by: Rosita Kea   Exercises Row with band/cable - 30 reps - 3x daily     PT Education - 09/10/19 2130    Education Details  Exercise purpose/form. Self management techniques. Education on diagnosis, prognosis, POC, anatomy and physiology of current condition. HEP    Person(s) Educated  Patient    Methods  Explanation;Demonstration;Tactile cues;Verbal cues;Handout    Comprehension  Verbalized understanding;Returned demonstration;Tactile cues required;Verbal cues required;Need further instruction       PT Short Term Goals - 09/05/19 1952      PT SHORT TERM GOAL #1   Title  Be independent with initial home exercise program for self-management of symptoms.    Baseline  Initial HEP to be assigned visit 2(09/05/2019);    Time  2    Period  Weeks    Status  New    Target Date  09/19/19        PT Long Term Goals - 09/10/19 2120      PT  LONG TERM GOAL #1   Title  Be independent with a long-term home exercise program for self-management of symptoms.    Baseline  Initial HEP to be established visit 2 (09/05/2019);    Time  6    Period  Weeks    Status  Partially Met    Target Date  10/16/18      PT LONG TERM GOAL #2   Title  Reduce pain with functional activities to equal or less than 1/10 to allow patient to complete usual activities including ADLs, IADLs, and social engagement with less difficulty.    Baseline  10/10 (09/05/2019);    Time  6    Period  Weeks    Status  On-going    Target Date  10/16/18      PT LONG TERM GOAL #3   Title  Have full thoracic AROM with no compensations or increase  in pain in all planes except intermittent end range discomfort to allow patient to complete valued activities with less difficulty.    Baseline  pain with R sidebending, R rotation, and extension (09/05/2019);    Time  6    Period  Weeks    Status  On-going    Target Date  10/16/18      PT LONG TERM GOAL #4   Title  Complete community, work and/or recreational activities without limitation due to current condition.    Baseline  limiting ability to complete her usual ADLs, IADLs, and social participation including anything that requires motion of the R trunk, yoga, laying on the back, sleeping, crunches, competitive golf, exercises prescribed for her low back pain, being active, exercise program (09/05/2019);    Time  6    Period  Weeks    Status  On-going    Target Date  10/16/18      PT LONG TERM GOAL #5   Title  Demonstrate improved FOTO score by 10 units to demonstrate improvement in overall condition and self-reported functional ability.    Baseline  to be measured visit 2 (09/05/2019); 44 (09/10/2019);    Time  6    Period  Weeks    Status  On-going    Target Date  10/16/18            Plan - 09/10/19 2125    Clinical Impression Statement  Patient tolerated treatment well but with some difficulty due to pain  in the mid thoracic spine and belching that worsened with activity and pain. Focused on further exploring thoracic strengthening and mobility exercises that were tolerable and challenging but patient still reported increased pain by end of session. Agreed to monitor her response over the next few days to compare to following last session. Patient continues to be limited by pain, limited activity tolerance, and belching with functional activity. Patient would benefit from continued physical therapy to address remaining impairments and functional limitations to work towards stated goals and return to PLOF or maximal functional independence.    Personal Factors and Comorbidities  Age;Comorbidity 3+;Time since onset of injury/illness/exacerbation;Past/Current Experience   long history of lack of identifiable cause and unsuccessful treatment   Comorbidities  Relevant past medical history and comorbidities include cholecystectomy 05/23/2019, asthma, chronic low back pain with most recent flair up the most severe she has had including L leg numbness to foot, B12 deficiency, splenectomy, L great toe surgery, Left shoulder surgery, hypothyroidism, possible mitral valve prolapse, heredity spherocytosis, anemia.    Examination-Activity Limitations  Sit;Transfers;Bed Mobility;Hygiene/Grooming;Sleep;Bend;Lift;Squat;Adult nurse;Reach Overhead;Other   anything that requires motion of the R trunk, yoga, laying on the back, sleeping, crunches, competitive golf, exercises prescribed for her low back pain, being active, exercise program   Examination-Participation Restrictions  Interpersonal Relationship;Yard Work;Cleaning;Community Activity   golfing, yoga   Stability/Clinical Decision Making  Evolving/Moderate complexity    Rehab Potential  Fair    PT Frequency  2x / week    PT Duration  6 weeks    PT Treatment/Interventions  ADLs/Self Care Home Management;Aquatic Therapy;Cryotherapy;Moist Heat;Therapeutic  activities;Therapeutic exercise;Neuromuscular re-education;Patient/family education;Functional mobility training;Manual techniques;Passive range of motion;Dry needling;Spinal Manipulations;Joint Manipulations    PT Next Visit Plan  auto-elongation; bird dog; carries    PT Home Exercise Plan  Medbridge Access Code: [email or web address]    Consulted and Agree with Plan of Care  Patient       Patient will benefit from skilled therapeutic intervention in order to improve the following  deficits and impairments:  Difficulty walking, Increased muscle spasms, Decreased range of motion, Impaired perceived functional ability, Decreased activity tolerance, Decreased strength, Impaired flexibility, Impaired UE functional use, Postural dysfunction, Pain, Decreased endurance, Decreased mobility  Visit Diagnosis: Pain in thoracic spine  Other muscle spasm  Muscle weakness (generalized)     Problem List Patient Active Problem List   Diagnosis Date Noted  . ACUTE SINUSITIS, UNSPECIFIED 10/16/2007  . RASH AND OTHER NONSPECIFIC SKIN ERUPTION 09/26/2007  . GOITER NOS 06/06/2007  . SPHEROCYTOSIS, HEREDITARY 06/06/2007  . MIGRAINE HEADACHE 06/06/2007  . ALLERGIC RHINITIS 06/06/2007  . ASTHMA 06/06/2007  . INTERSTITIAL CYSTITIS 06/06/2007  . LOW BACK PAIN, CHRONIC 06/06/2007  . Personal history of unspecified circulatory disease 06/06/2007    Everlean Alstrom. Graylon Good, PT, DPT 09/10/19, 9:30 PM  [facility] [address] [place], [address] Phone: [phone]   Fax:  [phone]  Name: KAMILL FULBRIGHT MRN: [id number] Date of Birth: 04/07/1961

## 2019-09-13 ENCOUNTER — Encounter: Payer: Self-pay | Admitting: Physical Therapy

## 2019-09-13 ENCOUNTER — Ambulatory Visit: Payer: 59 | Attending: Family Medicine | Admitting: Physical Therapy

## 2019-09-13 ENCOUNTER — Other Ambulatory Visit: Payer: Self-pay

## 2019-09-13 DIAGNOSIS — M546 Pain in thoracic spine: Secondary | ICD-10-CM

## 2019-09-13 DIAGNOSIS — M62838 Other muscle spasm: Secondary | ICD-10-CM

## 2019-09-13 DIAGNOSIS — M6281 Muscle weakness (generalized): Secondary | ICD-10-CM | POA: Diagnosis present

## 2019-09-13 NOTE — Therapy (Signed)
[facility] [address], [place], [address] Phone: [phone]   Fax:  [phone]  Physical Therapy Treatment  Patient Details  Name: Sophia [name] MRN: [id number] Date of Birth: Feb 08, 1961 Referring Provider (PT): Meeler, Loree Fee, NP   Encounter Date: 09/13/2019  PT End of Session - 09/13/19 2019    Visit Number  3    Number of Visits  12    Date for PT Re-Evaluation  10/17/19    Authorization Type  [employer] reporting period from 09/05/2019    Authorization - Visit Number  3    Authorization - Number of Visits  10    PT Start Time  1435    PT Stop Time  1515    PT Time Calculation (min)  40 min    Activity Tolerance  Patient tolerated treatment well;Patient limited by pain    Behavior During Therapy  [facility] for tasks assessed/performed       Past Medical History:  Diagnosis Date  . Allergic rhinitis   . Asthma, mild intermittent   . Mitral valve prolapse   . Thyroid disease     Past Surgical History:  Procedure Laterality Date  . SHOULDER SURGERY  1990s   Left  . SPLENECTOMY  1978  . TOE SURGERY      There were no vitals filed for this visit.  Subjective Assessment - 09/13/19 1437    Subjective  Patient reports she had elevated pain folloiwng last treatment session. She did some walking including some walking with a 8# dumbbell. Slept "okay" but not too bad. Woke up with a little pain but she was able to do her exercises. It did feel better after pulling the hsoulders back and doing rows. She also wore her thoracic brace some.  For the first 8 holes she had zero pain. Started up on the back 9 it started coming up and it was never as bad as earlier. Reports pain is about 2/10 pain upon arrival. When she does a curl she does have pain on the side. She brought the article she found.    Pertinent History  Patient is a 58 y.o. female who presents to outpatient physical therapy with a referral for  medical diagnosis chronic right flank pain. This patient's chief complaints consist of gradually worsening over the last 6 years, idiopathic onset,  intermittent stabbing/contant aching pain at R lateral trunk near ribs 8-10 that occasionally radiates anteriorly or towards spine and irritated with movement of this region in ipsilateral sidebending, rotation, or spine extension leading to the following functional deficits: any activity that requires moving the trunk in rotation, sidebending, extension including unable to do yoga (legs up the wall), laying on the back, raising head and shoulders up from lying on back, wakes her up at night, playing golf (able to play if takes deep breath and holds on back swing and until follow through is over). Continues to have gas when she does twisting and touching there, not exercising like she wants to, cannot do all lower back exercises for her low back pain, bicycles. She can play 1 day of golf and takes 2 days to recover. Used to play 4 x per week without difficulty. Relevant past medical history and comorbidities include cholecystectomy 05/23/2019, asthma, chronic low back pain with most recent flair up the most severe she has had including L leg numbness to foot, B12 deficiency, splenectomy, L great toe surgery, Left shoulder surgery, hypothyroidism, possible  mitral valve prolapse, heredity spherocytosis, anemia.    Limitations  Sitting    How long can you sit comfortably?  sitting okay    How long can you stand comfortably?  limited by low back: 30 min    How long can you walk comfortably?  able to walk 30-40 min    Diagnostic tests  Multiple abdominal CT scans, colonoscopy, endoscopy, radiographs negative. Has not had Thoracic Spine MRI.    Patient Stated Goals  "make the pain go away" and be able to play golf, exercise, do yoga, all that stuff that she was doing before that she could do before it was constant pain. Golf was the main thing she had to give up.     Pain Score  2     Pain Onset  More than a month ago       TREATMENT:   Therapeutic exercise:to centralize symptoms and improve ROM, strength, muscular endurance, and activity tolerance required for successful completion of functional activities.   - discussion and education on article, Lumbar MRI report, and findings from [employer].   - Donned posture brace at patient's preference.   - Standing spilt stance rows. 20# x30 - seated lat pull . 25# x5, 30# x25  - corner pec stretch 3x 30 seconds (first completed in door but too wide).   - standing B shoulder ER with supinated forearms x 20 with red theraband.  - lat/thoracic ext./shoulder flexion stretch foam roller up wall x 20, 1-3 second hold.  x10 (increaseing R shoulder pain).  Removed brace.   - standing horizontal abduction with green band, x10  - standing D2 flexion red theraband;  L side up x10, pain in mid back peripheralizing to R side, easing off after. R side x10   - Education on HEP including handout   Cuing throughout for improved form and appropriate actions based on pain response.    HOME EXERCISE PROGRAM Access Code: [email or web address]  URL: [email or web address]  Date: 09/13/2019  Prepared by: Rosita Kea   Exercises Row with band/cable - 30 reps - 3x daily Standing Shoulder External Rotation with Resistance - 2 sets - 15-20 reps - 1x daily Corner Pec Major Stretch - 3 reps - 30 seconds hold - 1x daily   PT Education - 09/13/19 2019    Education Details  Exercise purpose/form. Self management techniques. updated HEP    Person(s) Educated  Patient    Methods  Explanation;Demonstration;Tactile cues;Verbal cues;Handout    Comprehension  Verbalized understanding;Returned demonstration;Verbal cues required;Tactile cues required;Need further instruction       PT Short Term Goals - 09/05/19 1952      PT SHORT TERM GOAL #1   Title  Be independent with initial home exercise program for  self-management of symptoms.    Baseline  Initial HEP to be assigned visit 2(09/05/2019);    Time  2    Period  Weeks    Status  New    Target Date  09/19/19        PT Long Term Goals - 09/10/19 2120      PT LONG TERM GOAL #1   Title  Be independent with a long-term home exercise program for self-management of symptoms.    Baseline  Initial HEP to be established visit 2 (09/05/2019);    Time  6    Period  Weeks    Status  Partially Met    Target Date  10/16/18      PT LONG TERM  GOAL #2   Title  Reduce pain with functional activities to equal or less than 1/10 to allow patient to complete usual activities including ADLs, IADLs, and social engagement with less difficulty.    Baseline  10/10 (09/05/2019);    Time  6    Period  Weeks    Status  On-going    Target Date  10/16/18      PT LONG TERM GOAL #3   Title  Have full thoracic AROM with no compensations or increase in pain in all planes except intermittent end range discomfort to allow patient to complete valued activities with less difficulty.    Baseline  pain with R sidebending, R rotation, and extension (09/05/2019);    Time  6    Period  Weeks    Status  On-going    Target Date  10/16/18      PT LONG TERM GOAL #4   Title  Complete community, work and/or recreational activities without limitation due to current condition.    Baseline  limiting ability to complete her usual ADLs, IADLs, and social participation including anything that requires motion of the R trunk, yoga, laying on the back, sleeping, crunches, competitive golf, exercises prescribed for her low back pain, being active, exercise program (09/05/2019);    Time  6    Period  Weeks    Status  On-going    Target Date  10/16/18      PT LONG TERM GOAL #5   Title  Demonstrate improved FOTO score by 10 units to demonstrate improvement in overall condition and self-reported functional ability.    Baseline  to be measured visit 2 (09/05/2019); 44 (09/10/2019);     Time  6    Period  Weeks    Status  On-going    Target Date  10/16/18            Plan - 09/13/19 2025    Clinical Impression Statement  Patient tolerated treatment well overall. She was able to complete all activities without increase of concordant pain until after removing soft posture brace and completing asymmetrically loaded UE exercise. Also had some belching at start of session but was able to complete remainder of exercises with decreased and minimal belching. Updated HEP to include exercises that did not provoke pain when performed when pain free at baseline. Patient appears to respond well to postural strengthening but condition is quite irritable and sensitive. Patient appears to have improved slightly since last treatment session with improved tolerance for golfing. Pt required multimodal cuing for proper technique and to facilitate improved neuromuscular control, strength, range of motion, and functional ability resulting in improved performance and form. She continues to experience significant impairments that lead to restrictions in her functional activities and participation. Patient would benefit from continued physical therapy to address remaining impairments and functional limitations to work towards stated goals and return to PLOF or maximal functional independence.    Personal Factors and Comorbidities  Age;Comorbidity 3+;Time since onset of injury/illness/exacerbation;Past/Current Experience   long history of lack of identifiable cause and unsuccessful treatment   Comorbidities  Relevant past medical history and comorbidities include cholecystectomy 05/23/2019, asthma, chronic low back pain with most recent flair up the most severe she has had including L leg numbness to foot, B12 deficiency, splenectomy, L great toe surgery, Left shoulder surgery, hypothyroidism, possible mitral valve prolapse, heredity spherocytosis, anemia.    Examination-Activity Limitations   Sit;Transfers;Bed Mobility;Hygiene/Grooming;Sleep;Bend;Lift;Squat;Adult nurse;Reach Overhead;Other   anything that requires motion of  the R trunk, yoga, laying on the back, sleeping, crunches, competitive golf, exercises prescribed for her low back pain, being active, exercise program   Examination-Participation Restrictions  Interpersonal Relationship;Yard Work;Cleaning;Community Activity   golfing, yoga   Stability/Clinical Decision Making  Evolving/Moderate complexity    Rehab Potential  Fair    PT Frequency  2x / week    PT Duration  6 weeks    PT Treatment/Interventions  ADLs/Self Care Home Management;Aquatic Therapy;Cryotherapy;Moist Heat;Therapeutic activities;Therapeutic exercise;Neuromuscular re-education;Patient/family education;Functional mobility training;Manual techniques;Passive range of motion;Dry needling;Spinal Manipulations;Joint Manipulations    PT Next Visit Plan  postural strengthening, consider bird dog, carries, auto-elongation    PT Home Exercise Plan  Medbridge Access Code: [email or web address]    Consulted and Agree with Plan of Care  Patient       Patient will benefit from skilled therapeutic intervention in order to improve the following deficits and impairments:  Difficulty walking, Increased muscle spasms, Decreased range of motion, Impaired perceived functional ability, Decreased activity tolerance, Decreased strength, Impaired flexibility, Impaired UE functional use, Postural dysfunction, Pain, Decreased endurance, Decreased mobility  Visit Diagnosis: No diagnosis found.     Problem List Patient Active Problem List   Diagnosis Date Noted  . ACUTE SINUSITIS, UNSPECIFIED 10/16/2007  . RASH AND OTHER NONSPECIFIC SKIN ERUPTION 09/26/2007  . GOITER NOS 06/06/2007  . SPHEROCYTOSIS, HEREDITARY 06/06/2007  . MIGRAINE HEADACHE 06/06/2007  . ALLERGIC RHINITIS 06/06/2007  . ASTHMA 06/06/2007  . INTERSTITIAL CYSTITIS 06/06/2007  . LOW BACK PAIN, CHRONIC 06/06/2007   . Personal history of unspecified circulatory disease 06/06/2007    Everlean Alstrom. Graylon Good, PT, DPT 09/13/19, 8:26 PM  [facility] [address], [place], [address] Phone: [phone]   Fax:  [phone]  Name: GENE COLEE MRN: [id number] Date of Birth: 01/12/1961

## 2019-09-18 ENCOUNTER — Other Ambulatory Visit: Payer: Self-pay

## 2019-09-18 ENCOUNTER — Ambulatory Visit: Payer: 59 | Admitting: Physical Therapy

## 2019-09-18 ENCOUNTER — Encounter: Payer: Self-pay | Admitting: Physical Therapy

## 2019-09-18 DIAGNOSIS — M546 Pain in thoracic spine: Secondary | ICD-10-CM | POA: Diagnosis not present

## 2019-09-18 DIAGNOSIS — M62838 Other muscle spasm: Secondary | ICD-10-CM

## 2019-09-18 DIAGNOSIS — M6281 Muscle weakness (generalized): Secondary | ICD-10-CM

## 2019-09-18 NOTE — Therapy (Signed)
[name] [address], [place], [address] Phone: [phone]   Fax:  [phone]  Physical Therapy Treatment  Patient Details  Name: Sophia [name] MRN: [id number] Date of Birth: 10-Oct-1961 Referring Provider (PT): Meeler, Loree Fee, NP   Encounter Date: 09/18/2019  PT End of Session - 09/18/19 2043    Visit Number  4    Number of Visits  12    Date for PT Re-Evaluation  10/17/19    Authorization Type  [employer] reporting period from 09/05/2019    Authorization - Visit Number  4    Authorization - Number of Visits  10    PT Start Time  1761    PT Stop Time  1115    PT Time Calculation (min)  40 min    Activity Tolerance  Patient tolerated treatment well;Patient limited by pain    Behavior During Therapy  [facility] for tasks assessed/performed       Past Medical History:  Diagnosis Date  . Allergic rhinitis   . Asthma, mild intermittent   . Mitral valve prolapse   . Thyroid disease     Past Surgical History:  Procedure Laterality Date  . SHOULDER SURGERY  1990s   Left  . SPLENECTOMY  1978  . TOE SURGERY      There were no vitals filed for this visit.  Subjective Assessment - 09/18/19 1058    Subjective  Patient reports she had no pain upon arrival, but last night it was about 6/10 and it was keeping her away. It hurt more when she turned and she couldn't get comfortable. She states it has been feeling pretty good for the last few days since last treatment session. She has been doing her HEP at home. States her pain does not seem to be connected to what or when she eats.    Pertinent History  Patient is a 58 y.o. female who presents to outpatient physical therapy with a referral for medical diagnosis chronic right flank pain. This patient's chief complaints consist of gradually worsening over the last 6 years, idiopathic onset,  intermittent stabbing/contant aching pain at R lateral trunk near ribs 8-10  that occasionally radiates anteriorly or towards spine and irritated with movement of this region in ipsilateral sidebending, rotation, or spine extension leading to the following functional deficits: any activity that requires moving the trunk in rotation, sidebending, extension including unable to do yoga (legs up the wall), laying on the back, raising head and shoulders up from lying on back, wakes her up at night, playing golf (able to play if takes deep breath and holds on back swing and until follow through is over). Continues to have gas when she does twisting and touching there, not exercising like she wants to, cannot do all lower back exercises for her low back pain, bicycles. She can play 1 day of golf and takes 2 days to recover. Used to play 4 x per week without difficulty. Relevant past medical history and comorbidities include cholecystectomy 05/23/2019, asthma, chronic low back pain with most recent flair up the most severe she has had including L leg numbness to foot, B12 deficiency, splenectomy, L great toe surgery, Left shoulder surgery, hypothyroidism, possible mitral valve prolapse, heredity spherocytosis, anemia.    Limitations  Sitting    How long can you sit comfortably?  sitting okay    How long can you stand comfortably?  limited by low back: 30 min  How long can you walk comfortably?  able to walk 30-40 min    Diagnostic tests  Multiple abdominal CT scans, colonoscopy, endoscopy, radiographs negative. Has not had Thoracic Spine MRI.    Patient Stated Goals  "make the pain go away" and be able to play golf, exercise, do yoga, all that stuff that she was doing before that she could do before it was constant pain. Golf was the main thing she had to give up.    Currently in Pain?  No/denies    Pain Onset  More than a month ago       OBJECTIVE - Bermotti's Sign: increased pain when pressure placed at painful regions (largest scar from recent cholecystectomy, R upper abdominal  quadrant, and lateral chest wall at ribs - location of concordant pain).  - rib hook to check for slipping rib syndrome (R lower rib cage): painful. Palpation to this region is also painful.  - abdominal palpation: TTP along inferior border of R lower ribcage, R superior abdominal quadrant, over incisions from cholecystectomy, and with pressure to rib cage at R trunk from 2 inches caudle to axilla to just below rib cage. Attempted palpation of diaphragm but restricted by overlying soft tissue.     TREATMENT:  Therapeutic exercise:to centralize symptoms and improve ROM, strength, muscular endurance, and activity tolerance required for successful completion of functional activities.  - discussion and education on paperwork previously brought in by patient including article on sphincter of oddi dysfunction and findings from [employer].   - Supine palpation and Bermotti's test, rib hook. (see above) Sustained thoracic extension over pillow in hooklying for several minutes. With supine horizontal B shoulder abduction using red theraband x 20.  Patient reports elevated pain following palpation, exam and supine lying over pillow. Pain also radiating from mid thoracic spine.  - seated lat pull . 25# x5, 29# x30. Good carry over in technique. Feels discomfort in mid thoracic spine, but also feels like it needs to be stretched further.   - supine horizontal abduction with green band, x13, in standing x17. Valsalva improves pain.  Patient reported she would add to HEP.   - hanging from pull-up bar by BUE 3x10 seconds to stretch and provided distraction force on thoracic spine. Patient reported it felt good but continued to have discomfort following.   -Education on HEP   Cuing throughout for improved form and appropriate actions based on pain response.Education about valsalva.    HOME EXERCISE PROGRAM Access Code: [email or web address]  URL: [email or web address]  Date: 09/18/2019   Prepared by: Rosita Kea   Exercises Row with band/cable - 30 reps - 3x daily Standing Shoulder External Rotation with Resistance - 2 sets - 15-20 reps - 1x daily [place] Major Stretch - 3 reps - 30 seconds hold - 1x daily Standing Shoulder Horizontal Abduction with Resistance - 3 sets - 10 reps - 1x daily   PT Education - 09/18/19 2044    Education Details  Exercise purpose/form. Self management techniques. reviewed written information pt brought in last treatment session    Person(s) Educated  Patient    Methods  Explanation;Demonstration;Tactile cues;Verbal cues    Comprehension  Verbalized understanding;Returned demonstration;Verbal cues required;Tactile cues required;Need further instruction       PT Short Term Goals - 09/19/19 1140      PT SHORT TERM GOAL #1   Title  Be independent with initial home exercise program for self-management of symptoms.    Baseline  Initial HEP to be  assigned visit 2(09/05/2019);    Time  2    Period  Weeks    Status  Achieved    Target Date  09/19/19        PT Long Term Goals - 09/10/19 2120      PT LONG TERM GOAL #1   Title  Be independent with a long-term home exercise program for self-management of symptoms.    Baseline  Initial HEP to be established visit 2 (09/05/2019);    Time  6    Period  Weeks    Status  Partially Met    Target Date  10/16/18      PT LONG TERM GOAL #2   Title  Reduce pain with functional activities to equal or less than 1/10 to allow patient to complete usual activities including ADLs, IADLs, and social engagement with less difficulty.    Baseline  10/10 (09/05/2019);    Time  6    Period  Weeks    Status  On-going    Target Date  10/16/18      PT LONG TERM GOAL #3   Title  Have full thoracic AROM with no compensations or increase in pain in all planes except intermittent end range discomfort to allow patient to complete valued activities with less difficulty.    Baseline  pain with R sidebending, R  rotation, and extension (09/05/2019);    Time  6    Period  Weeks    Status  On-going    Target Date  10/16/18      PT LONG TERM GOAL #4   Title  Complete community, work and/or recreational activities without limitation due to current condition.    Baseline  limiting ability to complete her usual ADLs, IADLs, and social participation including anything that requires motion of the R trunk, yoga, laying on the back, sleeping, crunches, competitive golf, exercises prescribed for her low back pain, being active, exercise program (09/05/2019);    Time  6    Period  Weeks    Status  On-going    Target Date  10/16/18      PT LONG TERM GOAL #5   Title  Demonstrate improved FOTO score by 10 units to demonstrate improvement in overall condition and self-reported functional ability.    Baseline  to be measured visit 2 (09/05/2019); 44 (09/10/2019);    Time  6    Period  Weeks    Status  On-going    Target Date  10/16/18            Plan - 09/19/19 1140    Clinical Impression Statement  Patient tolerated treatment well overall but was limited by increased pain following further exam and palpation. Bermotti's Sign suggests chest wall/MSK source of pain vs visceral organ but sensitivity/specificity of test is unclear and patient is globally tender to palpation over the R upper abdominal quadrant. No specific positive findings on rib hook test. Continues to experience pain in thoracic spine that worsens concordant pain, continuing to suggest spinal dysfunction as a source of pain. Recommend considering further exploration of diaphragm next session. Patient would benefit from continued physical therapy to address remaining impairments and functional limitations to work towards stated goals and return to PLOF or maximal functional independence.    Personal Factors and Comorbidities  Age;Comorbidity 3+;Time since onset of injury/illness/exacerbation;Past/Current Experience   long history of lack of  identifiable cause and unsuccessful treatment   Comorbidities  Relevant past medical history and comorbidities include  cholecystectomy 05/23/2019, asthma, chronic low back pain with most recent flair up the most severe she has had including L leg numbness to foot, B12 deficiency, splenectomy, L great toe surgery, Left shoulder surgery, hypothyroidism, possible mitral valve prolapse, heredity spherocytosis, anemia.    Examination-Activity Limitations  Sit;Transfers;Bed Mobility;Hygiene/Grooming;Sleep;Bend;Lift;Squat;Adult nurse;Reach Overhead;Other   anything that requires motion of the R trunk, yoga, laying on the back, sleeping, crunches, competitive golf, exercises prescribed for her low back pain, being active, exercise program   Examination-Participation Restrictions  Interpersonal Relationship;Yard Work;Cleaning;Community Activity   golfing, yoga   Stability/Clinical Decision Making  Evolving/Moderate complexity    Rehab Potential  Fair    PT Frequency  2x / week    PT Duration  6 weeks    PT Treatment/Interventions  ADLs/Self Care Home Management;Aquatic Therapy;Cryotherapy;Moist Heat;Therapeutic activities;Therapeutic exercise;Neuromuscular re-education;Patient/family education;Functional mobility training;Manual techniques;Passive range of motion;Dry needling;Spinal Manipulations;Joint Manipulations    PT Next Visit Plan  postural strengthening, consider bird dog, carries, auto-elongation    PT Home Exercise Plan  Medbridge Access Code: [email or web address]    Consulted and Agree with Plan of Care  Patient       Patient will benefit from skilled therapeutic intervention in order to improve the following deficits and impairments:  Difficulty walking, Increased muscle spasms, Decreased range of motion, Impaired perceived functional ability, Decreased activity tolerance, Decreased strength, Impaired flexibility, Impaired UE functional use, Postural dysfunction, Pain, Decreased endurance,  Decreased mobility  Visit Diagnosis: Pain in thoracic spine  Other muscle spasm  Muscle weakness (generalized)     Problem List Patient Active Problem List   Diagnosis Date Noted  . ACUTE SINUSITIS, UNSPECIFIED 10/16/2007  . RASH AND OTHER NONSPECIFIC SKIN ERUPTION 09/26/2007  . GOITER NOS 06/06/2007  . SPHEROCYTOSIS, HEREDITARY 06/06/2007  . MIGRAINE HEADACHE 06/06/2007  . ALLERGIC RHINITIS 06/06/2007  . ASTHMA 06/06/2007  . INTERSTITIAL CYSTITIS 06/06/2007  . LOW BACK PAIN, CHRONIC 06/06/2007  . Personal history of unspecified circulatory disease 06/06/2007    Everlean Alstrom. Graylon Good, PT, DPT 09/19/19, 11:43 AM  [facility] [address] [place], [address] Phone: [phone]   Fax:  [phone]  Name: Sophia [name] MRN: [id number] Date of Birth: December 20, 1960

## 2019-09-25 ENCOUNTER — Encounter: Payer: Self-pay | Admitting: Physical Therapy

## 2019-09-25 ENCOUNTER — Ambulatory Visit: Payer: 59 | Admitting: Physical Therapy

## 2019-09-25 ENCOUNTER — Other Ambulatory Visit: Payer: Self-pay

## 2019-09-25 DIAGNOSIS — M6281 Muscle weakness (generalized): Secondary | ICD-10-CM

## 2019-09-25 DIAGNOSIS — M546 Pain in thoracic spine: Secondary | ICD-10-CM | POA: Diagnosis not present

## 2019-09-25 DIAGNOSIS — M62838 Other muscle spasm: Secondary | ICD-10-CM

## 2019-09-25 NOTE — Therapy (Signed)
[facility] [address], [place], [address] Phone: [phone]   Fax:  [phone]  Physical Therapy Treatment  Patient Details  Name: Sophia [name] MRN: [id number] Date of Birth: 1961-06-10 Referring Provider (PT): Meeler, Loree Fee, NP   Encounter Date: 09/25/2019  PT End of Session - 09/25/19 2035    Visit Number  5    Number of Visits  12    Date for PT Re-Evaluation  10/17/19    Authorization Type  [employer] reporting period from 09/05/2019    Authorization - Visit Number  5    Authorization - Number of Visits  10    PT Start Time  1120    PT Stop Time  1215    PT Time Calculation (min)  55 min    Activity Tolerance  Patient tolerated treatment well;Patient limited by pain    Behavior During Therapy  [facility] for tasks assessed/performed       Past Medical History:  Diagnosis Date  . Allergic rhinitis   . Asthma, mild intermittent   . Mitral valve prolapse   . Thyroid disease     Past Surgical History:  Procedure Laterality Date  . SHOULDER SURGERY  1990s   Left  . SPLENECTOMY  1978  . TOE SURGERY      There were no vitals filed for this visit.  Subjective Assessment - 09/25/19 1123    Subjective  Patient reports she continues to have her usual 2/10 pain in the R side. She reports increased pain in the R shoulder up to 6-7/10 the night after her last treatment session. She feels like horizontal shoulder abduction in standing and the pec stretch was aggrevating it, so she stopped the pec stretch. Reports some popping in the R shoulder. HEP has been going okay except for previously mentioned shoulder pain. Has not been wearing brace at all. Did play golf on Saturday and felt like she could swing a bit better and pain didn't hit her until the very end. Has not had as much burping and belching.    Pertinent History  Patient is a 58 y.o. female who presents to outpatient physical therapy with a referral  for medical diagnosis chronic right flank pain. This patient's chief complaints consist of gradually worsening over the last 6 years, idiopathic onset,  intermittent stabbing/contant aching pain at R lateral trunk near ribs 8-10 that occasionally radiates anteriorly or towards spine and irritated with movement of this region in ipsilateral sidebending, rotation, or spine extension leading to the following functional deficits: any activity that requires moving the trunk in rotation, sidebending, extension including unable to do yoga (legs up the wall), laying on the back, raising head and shoulders up from lying on back, wakes her up at night, playing golf (able to play if takes deep breath and holds on back swing and until follow through is over). Continues to have gas when she does twisting and touching there, not exercising like she wants to, cannot do all lower back exercises for her low back pain, bicycles. She can play 1 day of golf and takes 2 days to recover. Used to play 4 x per week without difficulty. Relevant past medical history and comorbidities include cholecystectomy 05/23/2019, asthma, chronic low back pain with most recent flair up the most severe she has had including L leg numbness to foot, B12 deficiency, splenectomy, L great toe surgery, Left shoulder surgery, hypothyroidism, possible mitral valve prolapse, heredity spherocytosis,  anemia.    Limitations  Sitting    How long can you sit comfortably?  sitting okay    How long can you stand comfortably?  limited by low back: 30 min    How long can you walk comfortably?  able to walk 30-40 min    Diagnostic tests  Multiple abdominal CT scans, colonoscopy, endoscopy, radiographs negative. Has not had Thoracic Spine MRI.    Patient Stated Goals  "make the pain go away" and be able to play golf, exercise, do yoga, all that stuff that she was doing before that she could do before it was constant pain. Golf was the main thing she had to give up.     Currently in Pain?  Yes    Pain Score  2     Pain Onset  More than a month ago         TREATMENT:  Therapeutic exercise:to centralize symptoms and improve ROM, strength, muscular endurance, and activity tolerance required for successful completion of functional activities.  - Upper body ergometer with no added resistance encourage joint nutrition, warm tissue, induce analgesic effect of aerobic exercise, improve muscular strength and endurance,  and prepare for remainder of session. x5 minutes (FW/BW split).   - door pec stretch with hands low 3x30 seconds.  - hanging from pull up bar for several seconds throughout treatment per pt preference. Sometimes felt good, other times felt more discomfort in mid thoracic spine or R side seemingly based on how aggravated it was prior. Multiple cavitations felt initial time.   - autoelongation: seated on theraball with PVC sticks positioned at both sides, practiced breathing while activating postural and shoulder girlde muscles for up to 5 minutes. Eventually started to feel increased pain in shoulder girdle, then thoracic spine, so discontinued for the day.  - quadruped scapular protraction/retracion x 20. Patient with diffiuclty isolating motion to scapulae but improved with extensive cuing. Found retraction irritating to mid-thoracic spine. Had to discontinue when she felt sharp pain and felt "stuck" on the mat for a while but was able to get up and walk around clinic while discussing pastimes with clinician as it relaxed.  - Standing spilt stance rows. 3x10 each side at 15#.  - attempted sidelying modified thoracic rotation, but only able to complete x 15 on the R side due to increasing pain.  - hooklying LTR to find more tolerable way to encourage improved ROM in thoracic rotation. Able to complete better.  - hooklying bridging focusing on bracing lumbar and thoracic spine instead of articulating it, resulting in improved tolerance to be able to  complete exercise.  - standing leaning on table with one hand while rotating thoracic spine by reaching towards ceiling then floor x 20 each side. practiced golf swing motion and discussed possible improved tolerance with flexed position. Patient then demonstrated ability to complete in less flexed position as well without difficulty/pain.  - practiced supine dead bug and bicycle variolations for abdominal strength as requested by patient.  - practiced front plank from knees and both side planks from knees with improved tolerance with cuing to keep spine still. Unable to complete side plank due to R shoulder pain on that side.   Noted for burping episodes when pain was reported to be worst, more so at start of session.    Cuing throughout for improved form and appropriate actions based on pain response. Education on HEP including handout   HOME EXERCISE PROGRAM Access Code: [email or web address]  URL: [email or web address]  Date: 09/25/2019  Prepared by: Rosita Kea   Exercises Row with band/cable - 30 reps - 3x daily Standing Shoulder External Rotation with Resistance - 2 sets - 15-20 reps - 1x daily Single arm row with band/cable - 2 sets - 25 reps - 1x daily Doorway Pec Stretch at 60 Elevation - 3 reps - 30 seconds hold - 1x daily Plank with Thoracic Rotation on Counter - 30 reps - 1x daily   PT Education - 09/25/19 2035    Education Details  Exercise purpose/form. Self management techniques.    Person(s) Educated  Patient    Methods  Explanation;Demonstration;Tactile cues;Verbal cues;Handout    Comprehension  Verbalized understanding;Returned demonstration;Verbal cues required;Tactile cues required;Need further instruction       PT Short Term Goals - 09/19/19 1140      PT SHORT TERM GOAL #1   Title  Be independent with initial home exercise program for self-management of symptoms.    Baseline  Initial HEP to be assigned visit 2(09/05/2019);    Time  2    Period  Weeks     Status  Achieved    Target Date  09/19/19        PT Long Term Goals - 09/10/19 2120      PT LONG TERM GOAL #1   Title  Be independent with a long-term home exercise program for self-management of symptoms.    Baseline  Initial HEP to be established visit 2 (09/05/2019);    Time  6    Period  Weeks    Status  Partially Met    Target Date  10/16/18      PT LONG TERM GOAL #2   Title  Reduce pain with functional activities to equal or less than 1/10 to allow patient to complete usual activities including ADLs, IADLs, and social engagement with less difficulty.    Baseline  10/10 (09/05/2019);    Time  6    Period  Weeks    Status  On-going    Target Date  10/16/18      PT LONG TERM GOAL #3   Title  Have full thoracic AROM with no compensations or increase in pain in all planes except intermittent end range discomfort to allow patient to complete valued activities with less difficulty.    Baseline  pain with R sidebending, R rotation, and extension (09/05/2019);    Time  6    Period  Weeks    Status  On-going    Target Date  10/16/18      PT LONG TERM GOAL #4   Title  Complete community, work and/or recreational activities without limitation due to current condition.    Baseline  limiting ability to complete her usual ADLs, IADLs, and social participation including anything that requires motion of the R trunk, yoga, laying on the back, sleeping, crunches, competitive golf, exercises prescribed for her low back pain, being active, exercise program (09/05/2019);    Time  6    Period  Weeks    Status  On-going    Target Date  10/16/18      PT LONG TERM GOAL #5   Title  Demonstrate improved FOTO score by 10 units to demonstrate improvement in overall condition and self-reported functional ability.    Baseline  to be measured visit 2 (09/05/2019); 44 (09/10/2019);    Time  6    Period  Weeks    Status  On-going    Target Date  10/16/18  Plan - 09/25/19 2050     Clinical Impression Statement  Patient tolerated treatment well overall and was able to progress to some rotational exercises with better tolerance but continued to be limited by pain. Pain pattern is most aggravated by thoracic extension, R sidebending, and R rotation. Belching episodes occurred when reported pain was jabbing. Had one episode of very elevated pain and "stuck" feeling but was able to ease her way out of it with walking and relaxation techniques. Mid thoracic spine pain and R side pain continue to be linked. Addressed need for modifications in her low back strengthening plan to accommodate pian in thoracic spine. Patient would benefit from continued physical therapy to address remaining impairments and functional limitations to work towards stated goals and return to PLOF or maximal functional independence.    Personal Factors and Comorbidities  Age;Comorbidity 3+;Time since onset of injury/illness/exacerbation;Past/Current Experience   long history of lack of identifiable cause and unsuccessful treatment   Comorbidities  Relevant past medical history and comorbidities include cholecystectomy 05/23/2019, asthma, chronic low back pain with most recent flair up the most severe she has had including L leg numbness to foot, B12 deficiency, splenectomy, L great toe surgery, Left shoulder surgery, hypothyroidism, possible mitral valve prolapse, heredity spherocytosis, anemia.    Examination-Activity Limitations  Sit;Transfers;Bed Mobility;Hygiene/Grooming;Sleep;Bend;Lift;Squat;Adult nurse;Reach Overhead;Other   anything that requires motion of the R trunk, yoga, laying on the back, sleeping, crunches, competitive golf, exercises prescribed for her low back pain, being active, exercise program   Examination-Participation Restrictions  Interpersonal Relationship;Yard Work;Cleaning;Community Activity   golfing, yoga   Stability/Clinical Decision Making  Evolving/Moderate complexity     Rehab Potential  Fair    PT Frequency  2x / week    PT Duration  6 weeks    PT Treatment/Interventions  ADLs/Self Care Home Management;Aquatic Therapy;Cryotherapy;Moist Heat;Therapeutic activities;Therapeutic exercise;Neuromuscular re-education;Patient/family education;Functional mobility training;Manual techniques;Passive range of motion;Dry needling;Spinal Manipulations;Joint Manipulations    PT Next Visit Plan  postural strengthening, consider bird dog, carries, auto-elongation    PT Home Exercise Plan  Medbridge Access Code: [email or web address]    Consulted and Agree with Plan of Care  Patient       Patient will benefit from skilled therapeutic intervention in order to improve the following deficits and impairments:  Difficulty walking, Increased muscle spasms, Decreased range of motion, Impaired perceived functional ability, Decreased activity tolerance, Decreased strength, Impaired flexibility, Impaired UE functional use, Postural dysfunction, Pain, Decreased endurance, Decreased mobility  Visit Diagnosis: Pain in thoracic spine  Other muscle spasm  Muscle weakness (generalized)     Problem List Patient Active Problem List   Diagnosis Date Noted  . ACUTE SINUSITIS, UNSPECIFIED 10/16/2007  . RASH AND OTHER NONSPECIFIC SKIN ERUPTION 09/26/2007  . GOITER NOS 06/06/2007  . SPHEROCYTOSIS, HEREDITARY 06/06/2007  . MIGRAINE HEADACHE 06/06/2007  . ALLERGIC RHINITIS 06/06/2007  . ASTHMA 06/06/2007  . INTERSTITIAL CYSTITIS 06/06/2007  . LOW BACK PAIN, CHRONIC 06/06/2007  . Personal history of unspecified circulatory disease 06/06/2007   Everlean Alstrom. Graylon Good, PT, DPT 09/25/19, 8:51 PM  [facility] [address] [place], [address] Phone: [phone]   Fax:  [phone]  Name: Sophia [name] MRN: [id number] Date of Birth: 1961/08/21

## 2019-09-28 ENCOUNTER — Ambulatory Visit
Admission: RE | Admit: 2019-09-28 | Discharge: 2019-09-28 | Disposition: A | Discharge status: Home / Self Care | Payer: 59 | Source: Ambulatory Visit | Attending: Family Medicine | Admitting: Family Medicine

## 2019-09-28 DIAGNOSIS — Z1231 Encounter for screening mammogram for malignant neoplasm of breast: Secondary | ICD-10-CM | POA: Diagnosis not present

## 2019-10-02 ENCOUNTER — Encounter: Payer: Self-pay | Admitting: Physical Therapy

## 2019-10-02 ENCOUNTER — Other Ambulatory Visit: Payer: Self-pay

## 2019-10-02 ENCOUNTER — Ambulatory Visit: Payer: 59 | Admitting: Physical Therapy

## 2019-10-02 DIAGNOSIS — M62838 Other muscle spasm: Secondary | ICD-10-CM

## 2019-10-02 DIAGNOSIS — M546 Pain in thoracic spine: Secondary | ICD-10-CM

## 2019-10-02 DIAGNOSIS — M6281 Muscle weakness (generalized): Secondary | ICD-10-CM

## 2019-10-02 NOTE — Therapy (Signed)
[facility] [address], [place], [address] Phone: [phone]   Fax:  [phone]  Physical Therapy Treatment  Patient Details  Name: Sophia [name] MRN: [id number] Date of Birth: [birth year] Referring Provider (PT): Meeler, Loree Fee, NP   Encounter Date: 10/02/2019  PT End of Session - 10/02/19 1715    Visit Number  6    Number of Visits  12    Date for PT Re-Evaluation  10/17/19    Authorization Type  [employer] reporting period from 09/05/2019    Authorization - Visit Number  6    Authorization - Number of Visits  10    PT Start Time  1520    PT Stop Time  1605    PT Time Calculation (min)  45 min    Activity Tolerance  Patient tolerated treatment well;Patient limited by pain    Behavior During Therapy  [facility] for tasks assessed/performed       Past Medical History:  Diagnosis Date  . Allergic rhinitis   . Asthma, mild intermittent   . Mitral valve prolapse   . Thyroid disease     Past Surgical History:  Procedure Laterality Date  . SHOULDER SURGERY  1990s   Left  . SPLENECTOMY  1978  . TOE SURGERY      There were no vitals filed for this visit.  Subjective Assessment - 10/02/19 1519    Subjective  Patient reports she has 5-6/10 pain in the R side and R shoulder. She states the shoulder just continues to hurt and does not seem related to side pain. And side pain just flaired up last night "really really bad" despite not doing anything besides regular stuff. That this is a pattern that she has noticed where it will just flair up for a few days. The belching is also worse and "driving me crazy." she is not sure what she will be able to do today. She states the pec stretch still seems to bother her shoulder slightly. Reports she has been doing rows and she can do without pain if she gets shoulder in "right position."  B shoulder ER is hurting shoulder, did not do thoracic rotation due to pain in side,  did not do single arm row. She has continued to do her back exercise and has been able to return to more of them and do some core strengthening that she went over with PT last session. Last night when her pain started she was just sitting watching TV. This is a pattern she noticies.    Pertinent History  Patient is a 58 y.o. female who presents to outpatient physical therapy with a referral for medical diagnosis chronic right flank pain. This patient's chief complaints consist of gradually worsening over the last 6 years, idiopathic onset,  intermittent stabbing/contant aching pain at R lateral trunk near ribs 8-10 that occasionally radiates anteriorly or towards spine and irritated with movement of this region in ipsilateral sidebending, rotation, or spine extension leading to the following functional deficits: any activity that requires moving the trunk in rotation, sidebending, extension including unable to do yoga (legs up the wall), laying on the back, raising head and shoulders up from lying on back, wakes her up at night, playing golf (able to play if takes deep breath and holds on back swing and until follow through is over). Continues to have gas when she does twisting and touching there, not exercising like she wants to, cannot  do all lower back exercises for her low back pain, bicycles. She can play 1 day of golf and takes 2 days to recover. Used to play 4 x per week without difficulty. Relevant past medical history and comorbidities include cholecystectomy 05/23/2019, asthma, chronic low back pain with most recent flair up the most severe she has had including L leg numbness to foot, B12 deficiency, splenectomy, L great toe surgery, Left shoulder surgery, hypothyroidism, possible mitral valve prolapse, heredity spherocytosis, anemia.    Limitations  Sitting    How long can you sit comfortably?  sitting okay    How long can you stand comfortably?  limited by low back: 30 min    How long can you walk  comfortably?  able to walk 30-40 min    Diagnostic tests  Multiple abdominal CT scans, colonoscopy, endoscopy, radiographs negative. Has not had Thoracic Spine MRI.    Patient Stated Goals  "make the pain go away" and be able to play golf, exercise, do yoga, all that stuff that she was doing before that she could do before it was constant pain. Golf was the main thing she had to give up.    Currently in Pain?  Yes    Pain Score  6     Pain Onset  More than a month ago        TREATMENT:  Therapeutic exercise:to centralize symptoms and improve ROM, strength, muscular endurance, and activity tolerance required for successful completion of functional activities.  - review of HEP with patient, removed all exercises that seem to be bothering R shoulder.  - education and discussion of chronic pain and changes that can happen in the nervous system. Took an informative and empathetic approach and made clear that this information was to help her understand some of the recommendations that have been made to her for treatment when all medical investigations so far have not found an explanation of her pain. Patient continues to feel that there is something inside her causing the pain and we discussed her PT POC, progress so far, course that recovery can take, differential diagnosis from a PT perspective.   - pendulum exercise for R shoulder: able to complete with no weight but preferred 2# dumbbell. X 20 in self-selected direction. Required cuing for body motion instead of AROM of shoulder. Added to HEP.  - attempted sidelying (R side up) R scapular AROM to resisted exercises, but patient could not tolerate sidelying for longer than a few minutes and did not progress past 5 reps AROM D1 R scapular flexion/extension due to pain. Attempted re-positioning with pillow under L ribs but caused more pain. Pillow between legs improved low back pain, but did not effect side pain.  - hooklying diaphragmatic breathing  including review of handout and extensive cuing to learn how to breathe from belly without moving chest significantly Educated on using it for relaxation to help with any spasms contributing to side pain.   - patient noted that she did not have increased pain when lying supine and lifting both legs off the the mat but did have increased pain when lifting head and upper back off the mat (spinal flexion); also noted to have improved pain with valsalva prior to attempt to rotate spine in standing and reports this is consistent in her pain experience.   Manual therapy: to reduce pain and tissue tension, improve range of motion, neuromodulation, in order to promote improved ability to complete functional activities. - seated palpation of tender regions (  R lower ribs, R upper abdominal quadrant).  - CPA in seated through out thoracic region, did not reproduce side pain but was still tender R sided thoracic spine near T8. Unable to get deep pressure due to seated position and counter-force at sternum. Did not lie prone to prevent compression of rib cage.    HOME EXERCISE PROGRAM Access Code: [email or web address]  URL: [email or web address]  Date: 10/02/2019  Prepared by: Rosita Kea   Exercises Row with band/cable - 30 reps - 3x daily Circular Shoulder Pendulum with Table Support - 20 reps - 2-3x daily    PT Short Term Goals - 09/19/19 1140      PT SHORT TERM GOAL #1   Title  Be independent with initial home exercise program for self-management of symptoms.    Baseline  Initial HEP to be assigned visit 2(09/05/2019);    Time  2    Period  Weeks    Status  Achieved    Target Date  09/19/19        PT Long Term Goals - 09/10/19 2120      PT LONG TERM GOAL #1   Title  Be independent with a long-term home exercise program for self-management of symptoms.    Baseline  Initial HEP to be established visit 2 (09/05/2019);    Time  6    Period  Weeks    Status  Partially Met    Target Date   10/16/18      PT LONG TERM GOAL #2   Title  Reduce pain with functional activities to equal or less than 1/10 to allow patient to complete usual activities including ADLs, IADLs, and social engagement with less difficulty.    Baseline  10/10 (09/05/2019);    Time  6    Period  Weeks    Status  On-going    Target Date  10/16/18      PT LONG TERM GOAL #3   Title  Have full thoracic AROM with no compensations or increase in pain in all planes except intermittent end range discomfort to allow patient to complete valued activities with less difficulty.    Baseline  pain with R sidebending, R rotation, and extension (09/05/2019);    Time  6    Period  Weeks    Status  On-going    Target Date  10/16/18      PT LONG TERM GOAL #4   Title  Complete community, work and/or recreational activities without limitation due to current condition.    Baseline  limiting ability to complete her usual ADLs, IADLs, and social participation including anything that requires motion of the R trunk, yoga, laying on the back, sleeping, crunches, competitive golf, exercises prescribed for her low back pain, being active, exercise program (09/05/2019);    Time  6    Period  Weeks    Status  On-going    Target Date  10/16/18      PT LONG TERM GOAL #5   Title  Demonstrate improved FOTO score by 10 units to demonstrate improvement in overall condition and self-reported functional ability.    Baseline  to be measured visit 2 (09/05/2019); 44 (09/10/2019);    Time  6    Period  Weeks    Status  On-going    Target Date  10/16/18            Plan - 10/02/19 1715    Clinical Impression Statement  Patient tolerated treatment well overall but was  very limited in what she could do because of R shoulder and R side pain. Noted for burping episodes throughout session, worst when pain was reported to be worst, more so at start of session. Appears to be experiencing a flair in symptoms for no apparent reason that started  last night while she was watching TV and follows a pattern of this problem when it will flair every few weeks (but unsure if it is a regular time period) for no apparent reason and be worse for several days. She states today is the worst it has been since she started PT, and she feels that the flair is not related to her PT exercises or other activities. Continues to have sensitivity to spinal motion as well. After discussing that recovery does not always appear as linear progress and may have ups and downs, agreed to schedule one more session before she returns to her referring clinician. Patient would benefit from continued physical therapy to address remaining impairments and functional limitations to work towards stated goals and return to PLOF or maximal functional independence    Personal Factors and Comorbidities  Age;Comorbidity 3+;Time since onset of injury/illness/exacerbation;Past/Current Experience   long history of lack of identifiable cause and unsuccessful treatment   Comorbidities  Relevant past medical history and comorbidities include cholecystectomy 05/23/2019, asthma, chronic low back pain with most recent flair up the most severe she has had including L leg numbness to foot, B12 deficiency, splenectomy, L great toe surgery, Left shoulder surgery, hypothyroidism, possible mitral valve prolapse, heredity spherocytosis, anemia.    Examination-Activity Limitations  Sit;Transfers;Bed Mobility;Hygiene/Grooming;Sleep;Bend;Lift;Squat;Adult nurse;Reach Overhead;Other   anything that requires motion of the R trunk, yoga, laying on the back, sleeping, crunches, competitive golf, exercises prescribed for her low back pain, being active, exercise program   Examination-Participation Restrictions  Interpersonal Relationship;Yard Work;Cleaning;Community Activity   golfing, yoga   Stability/Clinical Decision Making  Evolving/Moderate complexity    Rehab Potential  Fair    PT Frequency  2x /  week    PT Duration  6 weeks    PT Treatment/Interventions  ADLs/Self Care Home Management;Aquatic Therapy;Cryotherapy;Moist Heat;Therapeutic activities;Therapeutic exercise;Neuromuscular re-education;Patient/family education;Functional mobility training;Manual techniques;Passive range of motion;Dry needling;Spinal Manipulations;Joint Manipulations    PT Next Visit Plan  postural strengthening, consider bird dog, carries, auto-elongation    PT Home Exercise Plan  Medbridge Access Code: [email or web address]    Consulted and Agree with Plan of Care  Patient       Patient will benefit from skilled therapeutic intervention in order to improve the following deficits and impairments:  Difficulty walking, Increased muscle spasms, Decreased range of motion, Impaired perceived functional ability, Decreased activity tolerance, Decreased strength, Impaired flexibility, Impaired UE functional use, Postural dysfunction, Pain, Decreased endurance, Decreased mobility  Visit Diagnosis: Pain in thoracic spine  Other muscle spasm  Muscle weakness (generalized)     Problem List Patient Active Problem List   Diagnosis Date Noted  . ACUTE SINUSITIS, UNSPECIFIED 10/16/2007  . RASH AND OTHER NONSPECIFIC SKIN ERUPTION 09/26/2007  . GOITER NOS 06/06/2007  . SPHEROCYTOSIS, HEREDITARY 06/06/2007  . MIGRAINE HEADACHE 06/06/2007  . ALLERGIC RHINITIS 06/06/2007  . ASTHMA 06/06/2007  . INTERSTITIAL CYSTITIS 06/06/2007  . LOW BACK PAIN, CHRONIC 06/06/2007  . Personal history of unspecified circulatory disease 06/06/2007    Everlean Alstrom. Graylon Good, PT, DPT 10/02/19, 5:19 PM  [facility] [address], [place], [address] Phone: [phone]   Fax:  [phone]  Name: Sophia [name]  MRN: [id number] Date of Birth: 07/15/1961

## 2019-10-04 ENCOUNTER — Encounter: Payer: 59 | Admitting: Physical Therapy

## 2019-10-09 ENCOUNTER — Ambulatory Visit: Payer: 59 | Admitting: Physical Therapy

## 2019-10-10 ENCOUNTER — Ambulatory Visit: Payer: 59 | Admitting: Physical Therapy

## 2019-10-10 ENCOUNTER — Encounter: Payer: Self-pay | Admitting: Physical Therapy

## 2019-10-10 ENCOUNTER — Other Ambulatory Visit: Payer: Self-pay

## 2019-10-10 DIAGNOSIS — M546 Pain in thoracic spine: Secondary | ICD-10-CM

## 2019-10-10 DIAGNOSIS — M6281 Muscle weakness (generalized): Secondary | ICD-10-CM

## 2019-10-10 DIAGNOSIS — M62838 Other muscle spasm: Secondary | ICD-10-CM

## 2019-10-10 NOTE — Therapy (Signed)
[facility] [address], [place], [address] Phone: [phone]   Fax:  [phone]  Physical Therapy Treatment  Patient Details  Name: Sophia [name] MRN: [id number] Date of Birth: 01-05-1961 Referring Provider (PT): Meeler, Loree Fee, NP   Encounter Date: 10/10/2019  PT End of Session - 10/10/19 1815    Visit Number  7    Number of Visits  12    Date for PT Re-Evaluation  10/17/19    Authorization Type  [employer] reporting period from 09/05/2019    Authorization - Visit Number  7    Authorization - Number of Visits  10    PT Start Time  1300    PT Stop Time  1345    PT Time Calculation (min)  45 min    Activity Tolerance  Patient tolerated treatment well    Behavior During Therapy  [facility] for tasks assessed/performed       Past Medical History:  Diagnosis Date  . Allergic rhinitis   . Asthma, mild intermittent   . Mitral valve prolapse   . Thyroid disease     Past Surgical History:  Procedure Laterality Date  . SHOULDER SURGERY  1990s   Left  . SPLENECTOMY  1978  . TOE SURGERY      There were no vitals filed for this visit.  Subjective Assessment - 10/10/19 1303    Subjective  Patient reports she is feeling much better today compated to last treatment session. Her pain is about 2/10 today in her usual places and her flair in pain seems to have subsided. She was able to golf yesterday but her swing was limited by pain. She plans to return to her physician on 10/17/2019. She states she realized the ribs on her R side were popping and feeling better or worse when she moved at times and wonders again about slipping rib syndrome. States her R shoulder is feeling better. She has continued to perform rows but discontinued all overhead activities.    Pertinent History  Patient is a 58 y.o. female who presents to outpatient physical therapy with a referral for medical diagnosis chronic right flank pain. This  patient's chief complaints consist of gradually worsening over the last 6 years, idiopathic onset,  intermittent stabbing/contant aching pain at R lateral trunk near ribs 8-10 that occasionally radiates anteriorly or towards spine and irritated with movement of this region in ipsilateral sidebending, rotation, or spine extension leading to the following functional deficits: any activity that requires moving the trunk in rotation, sidebending, extension including unable to do yoga (legs up the wall), laying on the back, raising head and shoulders up from lying on back, wakes her up at night, playing golf (able to play if takes deep breath and holds on back swing and until follow through is over). Continues to have gas when she does twisting and touching there, not exercising like she wants to, cannot do all lower back exercises for her low back pain, bicycles. She can play 1 day of golf and takes 2 days to recover. Used to play 4 x per week without difficulty. Relevant past medical history and comorbidities include cholecystectomy 05/23/2019, asthma, chronic low back pain with most recent flair up the most severe she has had including L leg numbness to foot, B12 deficiency, splenectomy, L great toe surgery, Left shoulder surgery, hypothyroidism, possible mitral valve prolapse, heredity spherocytosis, anemia.    Limitations  Sitting    How  long can you sit comfortably?  sitting okay    How long can you stand comfortably?  limited by low back: 30 min    How long can you walk comfortably?  able to walk 30-40 min    Diagnostic tests  Multiple abdominal CT scans, colonoscopy, endoscopy, radiographs negative. Has not had Thoracic Spine MRI.    Patient Stated Goals  "make the pain go away" and be able to play golf, exercise, do yoga, all that stuff that she was doing before that she could do before it was constant pain. Golf was the main thing she had to give up.    Currently in Pain?  Yes    Pain Score  2     Pain  Onset  More than a month ago         TREATMENT:  Therapeutic exercise:to centralize symptoms and improve ROM, strength, muscular endurance, and activity tolerance required for successful completion of functional activities.  - review of information with patient about differential diagnoses of her pain including articles that include slipping rib syndrome. Also reviewed report from [employer] and noted it does not mention slipping rib syndrome. - seated R thoracic rotation with self-mobilization of rib cranially using belt during movement. Patient able to find appropriate level and perform with improved ROM and decreased pain with effort and practice. X 10 once able. Attempted similar exercise in supine, but unable to move well while securing belt properly.  - patient completed a few reps of AROM R rotation several times after each set of manual and self-performed mobilization with movement (MWM)to assess outcome. Improved ability to complete pain free AROM following MWM.  - seated R thoracic rotation while in L sidebending x 10 without pain, attempted left rotation with L sidebending but discontinued due to pain.  - attempted L sidebending of thoracic spine stretch in doorway, but patient limited by inability to use R shoulder overhead comfortably.  - patient practiced hanging breifly from pull up bar due to feeling relief from performing this in the past except for risk of R shoulder pain.  Attempted to find alternative by bracing on hands on chair arm rests, but unable to relax spine in this position and did not adequately substitute for hanging. - Education on HEP including performing self MWM x 10-20 reps every 2 hours.   Manual therapy: to reduce pain and tissue tension, improve range of motion, neuromodulation, in order to promote improved ability to complete functional activities. - supine palpation of R upper abdomial quadrant and boarder of ribs.     - rib hooking  test:  + for popping and pain near R ribs 8-9 and superior, but not inferior.  - seated manual mobilization with movement at several levels on right, moving rib superior while patient actively moves to the right. Able to improve motion with decreased pain with mobilization of correct level (just above painful level). Taught self-mobilization (See above) - sidelying manual mobilization with movement to same level as above while patient rotated thoracic spine to the right in open book format. X 15 with improved ROM and no pain.  - seated manual mobilization with movement as above x 15.   HOME EXERCISE PROGRAM Access Code: [email or web address]      URL: [email or web address]    Date: 10/02/2019  Prepared by: Rosita Kea   Exercises Row with band/cable - 30 reps - 3x daily Circular Shoulder Pendulum with Table Support - 20 reps - 2-3x daily  10/10/2019: Seated mobilization  with movement using strap to rotate thoracic spine R while pulling rib above affected rib cranially. 10-20 every 2 hours or when hurting until next session. Unable to locate adequate handout.     PT Education - 10/10/19 1815    Education Details  Exercise purpose/form. Self management techniques. review of information with patient about differential diagnoses of her pain including articles that include slipping rib syndrome. Also reviewed report from [employer] and noted it does not mention slipping rib syndrome    Person(s) Educated  Patient    Methods  Explanation;Demonstration;Tactile cues;Verbal cues    Comprehension  Verbalized understanding;Returned demonstration;Verbal cues required;Tactile cues required;Need further instruction       PT Short Term Goals - 09/19/19 1140      PT SHORT TERM GOAL #1   Title  Be independent with initial home exercise program for self-management of symptoms.    Baseline  Initial HEP to be assigned visit 2(09/05/2019);    Time  2    Period  Weeks    Status  Achieved     Target Date  09/19/19        PT Long Term Goals - 09/10/19 2120      PT LONG TERM GOAL #1   Title  Be independent with a long-term home exercise program for self-management of symptoms.    Baseline  Initial HEP to be established visit 2 (09/05/2019);    Time  6    Period  Weeks    Status  Partially Met    Target Date  10/16/18      PT LONG TERM GOAL #2   Title  Reduce pain with functional activities to equal or less than 1/10 to allow patient to complete usual activities including ADLs, IADLs, and social engagement with less difficulty.    Baseline  10/10 (09/05/2019);    Time  6    Period  Weeks    Status  On-going    Target Date  10/16/18      PT LONG TERM GOAL #3   Title  Have full thoracic AROM with no compensations or increase in pain in all planes except intermittent end range discomfort to allow patient to complete valued activities with less difficulty.    Baseline  pain with R sidebending, R rotation, and extension (09/05/2019);    Time  6    Period  Weeks    Status  On-going    Target Date  10/16/18      PT LONG TERM GOAL #4   Title  Complete community, work and/or recreational activities without limitation due to current condition.    Baseline  limiting ability to complete her usual ADLs, IADLs, and social participation including anything that requires motion of the R trunk, yoga, laying on the back, sleeping, crunches, competitive golf, exercises prescribed for her low back pain, being active, exercise program (09/05/2019);    Time  6    Period  Weeks    Status  On-going    Target Date  10/16/18      PT LONG TERM GOAL #5   Title  Demonstrate improved FOTO score by 10 units to demonstrate improvement in overall condition and self-reported functional ability.    Baseline  to be measured visit 2 (09/05/2019); 44 (09/10/2019);    Time  6    Period  Weeks    Status  On-going    Target Date  10/16/18  Plan - 10/10/19 1814    Clinical Impression  Statement  Patient tolerated treatment well and responded very well with abolishment of pain during motion and improved ROM to R thoracic rotation with MWM technique applied manually. This also improved motion and pain following repeated mobilizations. Patient learned how to perform self-mobilization for repetition at home. Patient continues to be limited in R sidebending, rotation, and extension in the thoracic spine but her condition responded positively to interventions this session and continues to appear musculoskeletal in nature. 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 Up to this point, patient has had limited success with overall improvement in response to physical therapist management. Plan to continue providing physical therapy interventions targeting this condition, but also advised patient to discuss with her referring provider at her upcoming session. Given persistent and negative impact of her symptoms on her function, she may require further medical treatment if conservative treatment fails. Patient would benefit from continued management of limiting condition by skilled physical therapist to address remaining impairments and functional limitations to work towards stated goals and return to PLOF or maximal functional independence    Personal Factors and Comorbidities  Age;Comorbidity 3+;Time since onset of injury/illness/exacerbation;Past/Current Experience   long history of lack of identifiable cause and unsuccessful treatment   Comorbidities  Relevant past medical history and comorbidities include cholecystectomy 05/23/2019, asthma, chronic low back pain with most recent flair up the most severe she has had including L leg numbness to foot, B12 deficiency, splenectomy, L great toe surgery, Left shoulder surgery, hypothyroidism, possible mitral valve prolapse, heredity spherocytosis, anemia.    Examination-Activity  Limitations  Sit;Transfers;Bed Mobility;Hygiene/Grooming;Sleep;Bend;Lift;Squat;Adult nurse;Reach Overhead;Other   anything that requires motion of the R trunk, yoga, laying on the back, sleeping, crunches, competitive golf, exercises prescribed for her low back pain, being active, exercise program   Examination-Participation Restrictions  Interpersonal Relationship;Yard Work;Cleaning;Community Activity   golfing, yoga   Stability/Clinical Decision Making  Evolving/Moderate complexity    Rehab Potential  Fair    PT Frequency  2x / week    PT Duration  6 weeks    PT Treatment/Interventions  ADLs/Self Care Home Management;Aquatic Therapy;Cryotherapy;Moist Heat;Therapeutic activities;Therapeutic exercise;Neuromuscular re-education;Patient/family education;Functional mobility training;Manual techniques;Passive range of motion;Dry needling;Spinal Manipulations;Joint Manipulations    PT Next Visit Plan  MWM, interventions for slipping rib syndrome    PT Home Exercise Plan  Medbridge Access Code: [email or web address]    Consulted and Agree with Plan of Care  Patient       Patient will benefit from skilled therapeutic intervention in order to improve the following deficits and impairments:  Difficulty walking, Increased muscle spasms, Decreased range of motion, Impaired perceived functional ability, Decreased activity tolerance, Decreased strength, Impaired flexibility, Impaired UE functional use, Postural dysfunction, Pain, Decreased endurance, Decreased mobility  Visit Diagnosis: Pain in thoracic spine  Other muscle spasm  Muscle weakness (generalized)     Problem List Patient Active Problem List   Diagnosis Date Noted  . ACUTE SINUSITIS, UNSPECIFIED 10/16/2007  . RASH AND OTHER NONSPECIFIC SKIN ERUPTION 09/26/2007  . GOITER NOS 06/06/2007  . SPHEROCYTOSIS, HEREDITARY 06/06/2007  . MIGRAINE HEADACHE 06/06/2007  . ALLERGIC RHINITIS 06/06/2007  . ASTHMA 06/06/2007  . INTERSTITIAL CYSTITIS  06/06/2007  . LOW BACK PAIN, CHRONIC 06/06/2007  . Personal history of unspecified circulatory disease 06/06/2007    Everlean Alstrom. Graylon Good, PT, DPT 10/10/19, 6:17 PM  [facility] [address],  [place], [address] Phone: [phone]   Fax:  [phone]  Name: Sophia [name] MRN: [id number] Date of Birth: 1961/08/06

## 2019-10-11 ENCOUNTER — Encounter: Payer: 59 | Admitting: Physical Therapy

## 2019-10-17 ENCOUNTER — Other Ambulatory Visit: Payer: Self-pay | Admitting: Physical Medicine and Rehabilitation

## 2019-10-17 DIAGNOSIS — M5134 Other intervertebral disc degeneration, thoracic region: Secondary | ICD-10-CM

## 2019-10-17 DIAGNOSIS — M5414 Radiculopathy, thoracic region: Secondary | ICD-10-CM

## 2019-10-24 ENCOUNTER — Ambulatory Visit
Admission: RE | Admit: 2019-10-24 | Discharge: 2019-10-24 | Disposition: A | Discharge status: Home / Self Care | Payer: 59 | Source: Ambulatory Visit | Attending: Physical Medicine and Rehabilitation | Admitting: Physical Medicine and Rehabilitation

## 2019-10-24 ENCOUNTER — Other Ambulatory Visit: Payer: Self-pay

## 2019-10-24 DIAGNOSIS — M5414 Radiculopathy, thoracic region: Secondary | ICD-10-CM | POA: Insufficient documentation

## 2019-10-24 DIAGNOSIS — M5134 Other intervertebral disc degeneration, thoracic region: Secondary | ICD-10-CM | POA: Insufficient documentation

## 2019-11-07 ENCOUNTER — Encounter: Payer: Self-pay | Admitting: Physical Therapy

## 2019-11-07 ENCOUNTER — Ambulatory Visit: Payer: 59 | Attending: Family Medicine | Admitting: Physical Therapy

## 2019-11-07 ENCOUNTER — Other Ambulatory Visit: Payer: Self-pay

## 2019-11-07 DIAGNOSIS — M62838 Other muscle spasm: Secondary | ICD-10-CM

## 2019-11-07 DIAGNOSIS — M6281 Muscle weakness (generalized): Secondary | ICD-10-CM | POA: Insufficient documentation

## 2019-11-07 DIAGNOSIS — M546 Pain in thoracic spine: Secondary | ICD-10-CM | POA: Insufficient documentation

## 2019-11-07 NOTE — Therapy (Addendum)
[facility] [address], [place], [address] Phone: [phone]   Fax:  [phone]  Physical Therapy Treatment / Re-Certification / Progress Note Reporting period: 09/05/2019 - 11/07/2019  Patient Details  Name: Sophia [name] MRN: [id number] Date of Birth: 01/26/1961 Referring Provider (PT): Meeler, Loree Fee, NP   Encounter Date: 11/07/2019  PT End of Session - 11/07/19 1902    Visit Number  8    Number of Visits  20    Date for PT Re-Evaluation  01/30/20    Authorization Type  [employer] reporting period from 09/05/2019 (new reporting period 11/07/2019);    Authorization - Visit Number  8    Authorization - Number of Visits  10    PT Start Time  1120    PT Stop Time  1210    PT Time Calculation (min)  50 min    Activity Tolerance  Patient tolerated treatment well;Patient limited by pain    Behavior During Therapy  WFL for tasks assessed/performed       Past Medical History:  Diagnosis Date  . Allergic rhinitis   . Asthma, mild intermittent   . Mitral valve prolapse   . Thyroid disease     Past Surgical History:  Procedure Laterality Date  . SHOULDER SURGERY  1990s   Left  . SPLENECTOMY  1978  . TOE SURGERY      There were no vitals filed for this visit.  Subjective Assessment - 11/07/19 1129    Subjective  Patient reports she had almost "given up" when physical therapist called her yesterday to check on her and offer trying some specific rib techniques. States her PCP said she could not help her except drugs which she does not want to take. She has been referred to pain management but her appointment is not until march. She continues to have similar symptoms, stabbing pain at the right thorax, especially with right rotation, right sidebenidng, and extension and movements like this. Her symptoms fluxuate with periods of improvement and worsening every few weeks. Has not noticed anything new that  irritates it. She feels like exercise and stretching she learned in PT helps it a little bit. Her last treatment session she was extremely sore where clinician was mobilizing and although the concordant pain went away while performing the procedure it came back and was really sore the next day. Left sidebending with R rotation helps loosen it up so she can do some golfing wiht modified swing.    Pertinent History  Patient is a 59 y.o. female who presents to outpatient physical therapy with a referral for medical diagnosis chronic right flank pain. This patient's chief complaints consist of gradually worsening over the last 6 years, idiopathic onset,  intermittent stabbing/contant aching pain at R lateral trunk near ribs 8-10 that occasionally radiates anteriorly or towards spine and irritated with movement of this region in ipsilateral sidebending, rotation, or spine extension leading to the following functional deficits: any activity that requires moving the trunk in rotation, sidebending, extension including unable to do yoga (legs up the wall), laying on the back, raising head and shoulders up from lying on back, wakes her up at night, playing golf (able to play if takes deep breath and holds on back swing and until follow through is over). Continues to have gas when she does twisting and touching there, not exercising like she wants to, cannot do all lower back exercises for her low back pain,  bicycles. She can play 1 day of golf and takes 2 days to recover. Used to play 4 x per week without difficulty. Relevant past medical history and comorbidities include cholecystectomy 05/23/2019, asthma, chronic low back pain with most recent flair up the most severe she has had including L leg numbness to foot, B12 deficiency, splenectomy, L great toe surgery, Left shoulder surgery, hypothyroidism, possible mitral valve prolapse, heredity spherocytosis, anemia.    Limitations  Sitting    How long can you sit  comfortably?  sitting okay    How long can you stand comfortably?  limited by low back: 30 min    How long can you walk comfortably?  able to walk 30-40 min    Diagnostic tests  Thoracic MRI report 10/25/2019: "IMPRESSION: 1. Prominent degenerative endplate spurring along the right anterolateral aspect of the thoracic spine extending from T4-5 through T9-10. Findings could contribute to right-sided pain. 2. Right greater than left facet hypertrophy at T6-7 and T7-8without stenosis. 3. Small left paracentral disc protrusion at T3-4 with minimal flattening of the left hemi cord. No significant stenosis."    Patient Stated Goals  "make the pain go away" and be able to play golf, exercise, do yoga, all that stuff that she was doing before that she could do before it was constant pain. Golf was the main thing she had to give up.    Currently in Pain?  Yes    Pain Score  3     Pain Location  Thoracic    Pain Orientation  Right    Pain Descriptors / Indicators  Jabbing;Stabbing    Pain Type  Chronic pain    Pain Radiating Towards  slightly towards under R breast or towards spine    Pain Onset  More than a month ago    Pain Frequency  Constant    Aggravating Factors   laying on back, rotating and bending R, raising up arm, 'moving wrong", moving trunk,    Pain Relieving Factors  valsalva while swinging golf club, stretching into left sidebending then R rotation before golf, limiting motion    Effect of Pain on Daily Activities  Functional limitations: difficulty with anything that requires motion of the R side of the trunk, unable to do yoga (legs up the wall), laying on the back, raising head and shoulders up from lying on back, wakes her up at night, playing golf (able to play if takes deep breath and holds on back swing and until follow through is over; also better if she completes stretches beforehand). Continues to have gas when she does twisting and touching there, not exercising like she wants to,  difficulty with HEP for low back pain. She can play 1 day of golf if she modifies her swing. Used to play 4 x per week without difficulty.       [facility] PT Assessment - 11/07/19 0001      Assessment   Medical Diagnosis   chronic right flank pain    Referring Provider (PT)  Meeler, Loree Fee, NP    Onset Date/Surgical Date  06/04/13    Hand Dominance  Right    Prior Therapy  none for this problem prior to this episode of care.       Precautions   Precautions  Other (comment)   none for gallbladder; told not to kettelbells/HIIT for LBP     Restrictions   Weight Bearing Restrictions  No      Home Environment   Living  Environment  --   no concerns     Prior Function   Level of Independence  Independent    Vocation  Retired    Designer, jewellery, Editor, commissioning, lots of standing, walking, lifting    Leisure  golf (completative), yoga, working out, traveling, golf trips, beach, [place]   Overall Cognitive Status  Within Functional Limits for tasks assessed      Observation/Other Assessments   Observations  see note from 09/05/2019 for latest objecteve data    Focus on Therapeutic Outcomes (FOTO)   FOTO = 46      OBJECTIVE  OBSERVATION/INSPECTION  Posture: mild forward head, rounded shoulders, slumped in sitting.   Lower rib circumference excursion: appears WFL; no bilateral differences apparent by  Palpation during inhalation and exhalation.   SPINE MOTION Thoracic AROM *Indicates pain  Flexion: = 50.  Extension: = 20, mild pain in anterior R rib.  Rotation: R = 45, L = 40 (popped, painful)  Side Flexion: R = 30 painful at R rib, L = 35 popped All motions sore, but sharper stabbing pain noted above  MUSCLE PERFORMANCE (MMT):  No longer has pain with R serratus anterior testing. (5/5)  ACCESSORY MOTION:   Locally tender to CPA in thoracic spine worst T9-10  Tender to spring testing at R ribs 9 and 10 and higher but does not radiate.    Tender to mobilization of R ribs in axilla region down to rib 10. Somewhat inconsistent. Seemed very tender under axilla on R, but then found similar discomfort on contra lateral side. Seemed to have most concordant pain at side of R rib cage near rib 8. Tender to up glides and down glides ribs 4-9 on right side at various locations. Popping noted near rib 8.   PALPATION:  TTP in intercostal spaces at right thorax between ribs 4-10. No longer painful at R upper abdominal quadrant.   FUNCTIONAL MOBILITY:  Bed mobility: prone <> sit independent with pain   Transfers: sit <> stand independent with pain  EDUCATION/COGNITION: Patient is alert and oriented X 4.   TREATMENT:  Therapeutic exercise: to centralize symptoms and improve ROM, strength, muscular endurance, and activity tolerance required for successful completion of functional activities.  - measurements to assess progress (see above) - Education on diagnosis, prognosis, POC, anatomy and physiology of current condition.   Manual therapy: to reduce pain and tissue tension, improve range of motion, neuromodulation, in order to promote improved ability to complete functional activities. - prone CPA grade III throughout thoracic spine  - prone R sided rib springing at posterior angle, grade II-III, ribs 3-10 - R sided rib elevation and depression mobilizations, grade II-III as tolerated along posterior, lateral, and anterior ribs 4-10 to assess for mobility restrictions and pain response attempting to find motion that relieves pain. Performed in supine, sidelying, and prone.  - seated manual mobilization with movement at pain relieving level on right, moving rib superior while patient actively moves to the right. Able to improve motion with decreased pain with mobilization of correct level (approximately level 7). x10 - Seated taping over right rib pulling up from level of response to mobilization with movement, using leuko tape with  cover roll stretch applied first. Three strips, superior, lateral superior diagonal, and superior at back lateral to spine along rib towards axilla. Patient able to rotate to right more easily and reports improved pain slightly.    Therapeutic exercise was performed independently  and separately from manual interventions and required specific cuing.   HOME EXERCISE PROGRAM Access Code: [email or web address] URL: [email or web address] Date: 10/02/2019  Prepared by: Rosita Kea   Exercises Row with band/cable - 30 reps - 3x daily Circular Shoulder Pendulum with Table Support - 20 reps - 2-3x daily  10/10/2019: Seated mobilization with movement using strap to rotate thoracic spine R while pulling rib above affected rib cranially. 10-20 every 2 hours or when hurting until next session. Unable to locate adequate handout.    PT Education - 11/07/19 1906    Education Details  intervention purpose/form. POC    Person(s) Educated  Patient    Methods  Explanation;Demonstration;Tactile cues;Verbal cues    Comprehension  Verbalized understanding;Returned demonstration;Verbal cues required;Tactile cues required;Need further instruction       PT Short Term Goals - 09/19/19 1140      PT SHORT TERM GOAL #1   Title  Be independent with initial home exercise program for self-management of symptoms.    Baseline  Initial HEP to be assigned visit 2(09/05/2019);    Time  2    Period  Weeks    Status  Achieved    Target Date  09/19/19        PT Long Term Goals - 11/07/19 1903      PT LONG TERM GOAL #1   Title  Be independent with a long-term home exercise program for self-management of symptoms.    Baseline  Initial HEP to be established visit 2 (09/05/2019); patient currently participating in appropriate HEP (11/07/2019);    Time  12    Period  Weeks    Status  Partially Met    Target Date  01/30/20      PT LONG TERM GOAL #2   Title  Reduce pain with functional activities to  equal or less than 1/10 to allow patient to complete usual activities including ADLs, IADLs, and social engagement with less difficulty.    Baseline  10/10 (09/05/2019); continues to have high levels of pain at times (11/07/2019);    Time  12    Period  Weeks    Status  On-going    Target Date  01/30/20      PT LONG TERM GOAL #3   Title  Have full thoracic AROM with no compensations or increase in pain in all planes except intermittent end range discomfort to allow patient to complete valued activities with less difficulty.    Baseline  pain with R sidebending, R rotation, and extension (09/05/2019); improved but limited - see objective exam (11/07/2019);    Time  12    Period  Weeks    Status  Partially Met    Target Date  01/30/20      PT LONG TERM GOAL #4   Title  Complete community, work and/or recreational activities without limitation due to current condition.    Baseline  limiting ability to complete her usual ADLs, IADLs, and social participation including anything that requires motion of the R trunk, yoga, laying on the back, sleeping, crunches, competitive golf, exercises prescribed for her low back pain, being active, exercise program (09/05/2019); continues to have similar limitations but has less belching and has improved participation in golfing slightly (11/07/2019);    Time  12    Period  Weeks    Status  On-going    Target Date  01/30/20      PT LONG TERM GOAL #5   Title  Demonstrate improved FOTO score by  10 units to demonstrate improvement in overall condition and self-reported functional ability.    Baseline  to be measured visit 2 (09/05/2019); 44 (09/10/2019); 46 (11/07/2019);    Time  12    Period  Weeks    Status  On-going    Target Date  01/30/20            Plan - 11/07/19 1921    Clinical Impression Statement  Patient has attended 8 physical therapy sessions and is returning today after a 4 week break while she was getting further recommendations from her  physician and obtained an MRI. MRI showed no explanation for her symptoms and no sinister pathology. Patient has continued to work on her HEP while away from physical therapy and reports mild improvement in belching and has been able to better manage playing golf but still very restricted in this area. Over the course of treatment prior to break, patient demonstrated limited success with improvement until the last attended visit she was able to get some relief in clinic with mobilization with movement with superior glide near rib 7. Patient was referred by PCP to pain clinic with appointment scheduled in March.  She is returning to physical therapy to continue interventions targeting rib and thoracic spine dysfunction given her negative MRI and initial success with mobilization with movement. Today she continued to be tender at intercostal spaces and to mobilizations to ribs and thoracic spine but tolerated taping technique to help stabilize ribs at R thorax. Patient demonstrates improved thoracic mobility and decreased pain in abdomen. Patient continues to be limited by pain with thoracic R sidebending, rotation, and extension, decreased activity tolerance, ROM deficits, joint stiffness, muscle performance (strength/power/endurance), postural, and activity tolerance impairments that are limiting ability to complete her usual ADLs, IADLs, and social participation including anything that requires motion of the R trunk, yoga, laying on the back, sleeping, crunches, competitive golf, exercises prescribed for her low back pain, being active, exercise program  without difficulty. Patient would benefit from continued management of limiting condition by skilled physical therapist to address remaining impairments and functional limitations to work towards stated goals and return to PLOF or maximal functional independence. Recommend patient attend physical therapy up to 2 times per week for up to 12 weeks as needed.     Personal Factors and Comorbidities  Age;Comorbidity 3+;Time since onset of injury/illness/exacerbation;Past/Current Experience   long history of lack of identifiable cause and unsuccessful treatment   Comorbidities  Relevant past medical history and comorbidities include cholecystectomy 05/23/2019, asthma, chronic low back pain with most recent flair up the most severe she has had including L leg numbness to foot, B12 deficiency, splenectomy, L great toe surgery, Left shoulder surgery, hypothyroidism, possible mitral valve prolapse, heredity spherocytosis, anemia.    Examination-Activity Limitations  Sit;Transfers;Bed Mobility;Hygiene/Grooming;Sleep;Bend;Lift;Squat;Adult nurse;Reach Overhead;Other   anything that requires motion of the R trunk, yoga, laying on the back, sleeping, crunches, competitive golf, exercises prescribed for her low back pain, being active, exercise program   Examination-Participation Restrictions  Interpersonal Relationship;Yard Work;Cleaning;Community Activity   golfing, yoga   Stability/Clinical Decision Making  Evolving/Moderate complexity    Rehab Potential  Fair    PT Frequency  2x / week    PT Duration  6 weeks    PT Treatment/Interventions  ADLs/Self Care Home Management;Aquatic Therapy;Cryotherapy;Moist Heat;Therapeutic activities;Therapeutic exercise;Neuromuscular re-education;Patient/family education;Functional mobility training;Manual techniques;Passive range of motion;Dry needling;Spinal Manipulations;Joint Manipulations    PT Next Visit Plan  MWM, interventions for slipping rib syndrome    PT  Home Exercise Plan  Medbridge Access Code: [email or web address]    Consulted and Agree with Plan of Care  Patient       Patient will benefit from skilled therapeutic intervention in order to improve the following deficits and impairments:  Difficulty walking, Increased muscle spasms, Decreased range of motion, Impaired perceived functional ability, Decreased activity  tolerance, Decreased strength, Impaired flexibility, Impaired UE functional use, Postural dysfunction, Pain, Decreased endurance, Decreased mobility  Visit Diagnosis: Pain in thoracic spine - Plan: PT plan of care cert/re-cert  Other muscle spasm - Plan: PT plan of care cert/re-cert  Muscle weakness (generalized) - Plan: PT plan of care cert/re-cert     Problem List Patient Active Problem List   Diagnosis Date Noted  . ACUTE SINUSITIS, UNSPECIFIED 10/16/2007  . RASH AND OTHER NONSPECIFIC SKIN ERUPTION 09/26/2007  . GOITER NOS 06/06/2007  . SPHEROCYTOSIS, HEREDITARY 06/06/2007  . MIGRAINE HEADACHE 06/06/2007  . ALLERGIC RHINITIS 06/06/2007  . ASTHMA 06/06/2007  . INTERSTITIAL CYSTITIS 06/06/2007  . LOW BACK PAIN, CHRONIC 06/06/2007  . Personal history of unspecified circulatory disease 06/06/2007    Everlean Alstrom. Graylon Good, PT, DPT 11/07/19, 7:31 PM  [facility] [address] [place], [address] Phone: [phone]   Fax:  [phone]  Name: Sophia [name] MRN: [id number] Date of Birth: [birth year]

## 2019-11-13 ENCOUNTER — Other Ambulatory Visit: Payer: Self-pay

## 2019-11-13 ENCOUNTER — Ambulatory Visit: Payer: 59 | Attending: Family Medicine | Admitting: Physical Therapy

## 2019-11-13 DIAGNOSIS — M62838 Other muscle spasm: Secondary | ICD-10-CM | POA: Diagnosis present

## 2019-11-13 DIAGNOSIS — M546 Pain in thoracic spine: Secondary | ICD-10-CM | POA: Insufficient documentation

## 2019-11-13 DIAGNOSIS — M6281 Muscle weakness (generalized): Secondary | ICD-10-CM | POA: Diagnosis present

## 2019-11-13 NOTE — Therapy (Signed)
[facility] [address] [place], [address] Phone: [phone]   Fax:  [phone]  Physical Therapy Treatment  Patient Details  Name: Sophia [name] MRN: [id number] Date of Birth: [birth year] Referring Provider (PT): Meeler, Loree Fee, NP   Encounter Date: 11/13/2019  PT End of Session - 11/13/19 1923    Visit Number  9    Number of Visits  20    Date for PT Re-Evaluation  01/30/20    Authorization Type  [employer] reporting period from 09/05/2019 (new reporting period 11/07/2019);    Authorization - Visit Number  9    Authorization - Number of Visits  10    PT Start Time  0277    PT Stop Time  1615    PT Time Calculation (min)  38 min    Activity Tolerance  Patient tolerated treatment well;Patient limited by pain    Behavior During Therapy  WFL for tasks assessed/performed       Past Medical History:  Diagnosis Date  . Allergic rhinitis   . Asthma, mild intermittent   . Mitral valve prolapse   . Thyroid disease     Past Surgical History:  Procedure Laterality Date  . SHOULDER SURGERY  1990s   Left  . SPLENECTOMY  1978  . TOE SURGERY      There were no vitals filed for this visit.  Subjective Assessment - 11/13/19 1538    Subjective  Patient reports she is feeling pretty good today 1/10 unless she turns wrong. She feels like the tape really helped and she was able to sleep easier. She took it off with her shower but it was pretty sticky and she felt like she may be able to keep it on longer. Her boyfrend wanted to know what kind of tape it    Pertinent History  Patient is a 59 y.o. female who presents to outpatient physical therapy with a referral for medical diagnosis chronic right flank pain. This patient's chief complaints consist of gradually worsening over the last 6 years, idiopathic onset,  intermittent stabbing/contant aching pain at R lateral trunk near ribs 8-10 that occasionally radiates  anteriorly or towards spine and irritated with movement of this region in ipsilateral sidebending, rotation, or spine extension leading to the following functional deficits: any activity that requires moving the trunk in rotation, sidebending, extension including unable to do yoga (legs up the wall), laying on the back, raising head and shoulders up from lying on back, wakes her up at night, playing golf (able to play if takes deep breath and holds on back swing and until follow through is over). Continues to have gas when she does twisting and touching there, not exercising like she wants to, cannot do all lower back exercises for her low back pain, bicycles. She can play 1 day of golf and takes 2 days to recover. Used to play 4 x per week without difficulty. Relevant past medical history and comorbidities include cholecystectomy 05/23/2019, asthma, chronic low back pain with most recent flair up the most severe she has had including L leg numbness to foot, B12 deficiency, splenectomy, L great toe surgery, Left shoulder surgery, hypothyroidism, possible mitral valve prolapse, heredity spherocytosis, anemia.    Limitations  Sitting    How long can you sit comfortably?  sitting okay    How long can you stand comfortably?  limited by low back: 30 min    How long can you walk  comfortably?  able to walk 30-40 min    Diagnostic tests  Thoracic MRI report 10/25/2019: "IMPRESSION: 1. Prominent degenerative endplate spurring along the right anterolateral aspect of the thoracic spine extending from T4-5 through T9-10. Findings could contribute to right-sided pain. 2. Right greater than left facet hypertrophy at T6-7 and T7-8without stenosis. 3. Small left paracentral disc protrusion at T3-4 with minimal flattening of the left hemi cord. No significant stenosis."    Patient Stated Goals  "make the pain go away" and be able to play golf, exercise, do yoga, all that stuff that she was doing before that she could do before  it was constant pain. Golf was the main thing she had to give up.    Currently in Pain?  Yes    Pain Score  1     Pain Onset  More than a month ago       TREATMENT:   Therapeutic exercise: to centralize symptoms and improve ROM, strength, muscular endurance, and activity tolerance required for successful completion of functional activities.  - education on taping technique, supplies used, including drawing handout to help patient learn to teach SO how to perform similar taping at home.   Manual therapy: to reduce pain and tissue tension, improve range of motion, neuromodulation, in order to promote improved ability to complete functional activities. - Pone R sided rib elevation and depression mobilizations, grade II-III as tolerated along posterior, lateral ribs 4-8 to focusing on relief of pain. Attempted rotational mobilizations but painful.  - prone R scapular mobilization all directions and distraction as tolerated.  - seated manual mobilization with movement at pain relieving level on right, moving rib superior while patient actively moves to the right. Able to improve motion with decreased pain with mobilization of correct level (approximately level 7). x5 - Seated taping over right rib pulling up from level of response to mobilization with movement, using leuko tape with cover roll stretch applied first. Three strips, superior, lateral superior diagonal, and superior at back lateral to spine along rib towards axilla. Patient able to rotate to right more easily and reports improved pain slightly.     Therapeuticexercisewas performed independently and separately from manual interventions and required specific cuing.   HOME EXERCISE PROGRAM Access Code: [email or web address] URL: [email or web address] Date: 10/02/2019  Prepared by: Rosita Kea   Exercises Row with band/cable - 30 reps - 3x daily Circular Shoulder Pendulum with Table Support - 20 reps - 2-3x  daily  10/10/2019: Seated mobilization with movement using strap to rotate thoracic spine R while pulling rib above affected rib cranially. 10-20 every 2 hours or when hurting until next session. Unable to locate adequate handout.   PT Education - 11/13/19 1926    Education Details  intervention purpose/form. taping techniques for home use    Person(s) Educated  Patient    Methods  Explanation;Demonstration;Tactile cues;Verbal cues    Comprehension  Verbalized understanding;Verbal cues required;Tactile cues required;Need further instruction       PT Short Term Goals - 09/19/19 1140      PT SHORT TERM GOAL #1   Title  Be independent with initial home exercise program for self-management of symptoms.    Baseline  Initial HEP to be assigned visit 2(09/05/2019);    Time  2    Period  Weeks    Status  Achieved    Target Date  09/19/19        PT Long Term Goals - 11/07/19 1903  PT LONG TERM GOAL #1   Title  Be independent with a long-term home exercise program for self-management of symptoms.    Baseline  Initial HEP to be established visit 2 (09/05/2019); patient currently participating in appropriate HEP (11/07/2019);    Time  12    Period  Weeks    Status  Partially Met    Target Date  01/30/20      PT LONG TERM GOAL #2   Title  Reduce pain with functional activities to equal or less than 1/10 to allow patient to complete usual activities including ADLs, IADLs, and social engagement with less difficulty.    Baseline  10/10 (09/05/2019); continues to have high levels of pain at times (11/07/2019);    Time  12    Period  Weeks    Status  On-going    Target Date  01/30/20      PT LONG TERM GOAL #3   Title  Have full thoracic AROM with no compensations or increase in pain in all planes except intermittent end range discomfort to allow patient to complete valued activities with less difficulty.    Baseline  pain with R sidebending, R rotation, and extension (09/05/2019);  improved but limited - see objective exam (11/07/2019);    Time  12    Period  Weeks    Status  Partially Met    Target Date  01/30/20      PT LONG TERM GOAL #4   Title  Complete community, work and/or recreational activities without limitation due to current condition.    Baseline  limiting ability to complete her usual ADLs, IADLs, and social participation including anything that requires motion of the R trunk, yoga, laying on the back, sleeping, crunches, competitive golf, exercises prescribed for her low back pain, being active, exercise program (09/05/2019); continues to have similar limitations but has less belching and has improved participation in golfing slightly (11/07/2019);    Time  12    Period  Weeks    Status  On-going    Target Date  01/30/20      PT LONG TERM GOAL #5   Title  Demonstrate improved FOTO score by 10 units to demonstrate improvement in overall condition and self-reported functional ability.    Baseline  to be measured visit 2 (09/05/2019); 44 (09/10/2019); 46 (11/07/2019);    Time  12    Period  Weeks    Status  On-going    Target Date  01/30/20            Plan - 11/13/19 1928    Clinical Impression Statement  Patient tolerated treatment well today and was overall less painful throughout session. Able to perform more rib mobilizations in superior direction and further educated patient on taping technique due to good success following last session. Continued to have positive response to taping this session. Would benefit from exploration of further self-mobilization techniques. Patient would benefit from continued management of limiting condition by skilled physical therapist to address remaining impairments and functional limitations to work towards stated goals and return to PLOF or maximal functional independence.    Personal Factors and Comorbidities  Age;Comorbidity 3+;Time since onset of injury/illness/exacerbation;Past/Current Experience   long history  of lack of identifiable cause and unsuccessful treatment   Comorbidities  Relevant past medical history and comorbidities include cholecystectomy 05/23/2019, asthma, chronic low back pain with most recent flair up the most severe she has had including L leg numbness to foot, B12 deficiency, splenectomy,  L great toe surgery, Left shoulder surgery, hypothyroidism, possible mitral valve prolapse, heredity spherocytosis, anemia.    Examination-Activity Limitations  Sit;Transfers;Bed Mobility;Hygiene/Grooming;Sleep;Bend;Lift;Squat;Adult nurse;Reach Overhead;Other   anything that requires motion of the R trunk, yoga, laying on the back, sleeping, crunches, competitive golf, exercises prescribed for her low back pain, being active, exercise program   Examination-Participation Restrictions  Interpersonal Relationship;Yard Work;Cleaning;Community Activity   golfing, yoga   Stability/Clinical Decision Making  Evolving/Moderate complexity    Rehab Potential  Fair    PT Frequency  2x / week    PT Duration  6 weeks    PT Treatment/Interventions  ADLs/Self Care Home Management;Aquatic Therapy;Cryotherapy;Moist Heat;Therapeutic activities;Therapeutic exercise;Neuromuscular re-education;Patient/family education;Functional mobility training;Manual techniques;Passive range of motion;Dry needling;Spinal Manipulations;Joint Manipulations    PT Next Visit Plan  MWM, interventions for slipping rib syndrome    PT Home Exercise Plan  Medbridge Access Code: [email or web address]    Consulted and Agree with Plan of Care  Patient       Patient will benefit from skilled therapeutic intervention in order to improve the following deficits and impairments:  Difficulty walking, Increased muscle spasms, Decreased range of motion, Impaired perceived functional ability, Decreased activity tolerance, Decreased strength, Impaired flexibility, Impaired UE functional use, Postural dysfunction, Pain, Decreased endurance, Decreased  mobility  Visit Diagnosis: Pain in thoracic spine  Other muscle spasm  Muscle weakness (generalized)     Problem List Patient Active Problem List   Diagnosis Date Noted  . ACUTE SINUSITIS, UNSPECIFIED 10/16/2007  . RASH AND OTHER NONSPECIFIC SKIN ERUPTION 09/26/2007  . GOITER NOS 06/06/2007  . SPHEROCYTOSIS, HEREDITARY 06/06/2007  . MIGRAINE HEADACHE 06/06/2007  . ALLERGIC RHINITIS 06/06/2007  . ASTHMA 06/06/2007  . INTERSTITIAL CYSTITIS 06/06/2007  . LOW BACK PAIN, CHRONIC 06/06/2007  . Personal history of unspecified circulatory disease 06/06/2007    Everlean Alstrom. Graylon Good, PT, DPT 11/13/19, 7:28 PM  [facility] [address], [place], [address] Phone: [phone]   Fax:  [phone]  Name: Sophia [name] MRN: [id number] Date of Birth: Sep 24, 1961

## 2019-11-21 ENCOUNTER — Other Ambulatory Visit: Payer: Self-pay

## 2019-11-21 ENCOUNTER — Ambulatory Visit: Payer: 59 | Admitting: Physical Therapy

## 2019-11-21 ENCOUNTER — Encounter: Payer: Self-pay | Admitting: Physical Therapy

## 2019-11-21 DIAGNOSIS — M62838 Other muscle spasm: Secondary | ICD-10-CM

## 2019-11-21 DIAGNOSIS — M546 Pain in thoracic spine: Secondary | ICD-10-CM

## 2019-11-21 DIAGNOSIS — M6281 Muscle weakness (generalized): Secondary | ICD-10-CM

## 2019-11-21 NOTE — Therapy (Signed)
[facility] [address] [place], [address] Phone: [phone]   Fax:  [phone]  Physical Therapy Treatment / Progress Note Reporting period: 11/07/2019 - 11/21/2019  Patient Details  Name: Sophia [name] MRN: [id number] Date of Birth: 20-Jun-1961 Referring Provider (PT): Meeler, Loree Fee, NP   Encounter Date: 11/21/2019  PT End of Session - 11/21/19 1933    Visit Number  10    Number of Visits  20    Date for PT Re-Evaluation  01/30/20    Authorization Type  [employer] reporting period from 09/05/2019 (new reporting period 11/07/2019);    Authorization - Visit Number  10    Authorization - Number of Visits  10    PT Start Time  1520    PT Stop Time  1602    PT Time Calculation (min)  42 min    Activity Tolerance  Patient tolerated treatment well    Behavior During Therapy  WFL for tasks assessed/performed       Past Medical History:  Diagnosis Date  . Allergic rhinitis   . Asthma, mild intermittent   . Mitral valve prolapse   . Thyroid disease     Past Surgical History:  Procedure Laterality Date  . SHOULDER SURGERY  1990s   Left  . SPLENECTOMY  1978  . TOE SURGERY      There were no vitals filed for this visit.  Subjective Assessment - 11/21/19 1520    Subjective  Patient reports she is feeling very well today. She states she started doing some of Denise Austin's stretching routing in the morning and it has been helping a lot.  Shd does a lot of side bening with arms overhead, twising with reaches, etc.Pt states she does it every morning. She tried to leave the tape on but it got really itchy. She feels like she is doing really well and is able to work throug a bit of discomfort with the stretch at first and then it pops and feels better. She states she feels a lot better knowing her thoracic MRI was clear. She currently feels like her pain is pretty under control, but she has not been able to go  golfing recently becuase of the weather. would like to be able to come to PT if she has trouble but is currently doing pretty good. Rates highest pain at less than 1/10 over the last week.    Pertinent History  Patient is a 59 y.o. female who presents to outpatient physical therapy with a referral for medical diagnosis chronic right flank pain. This patient's chief complaints consist of gradually worsening over the last 6 years, idiopathic onset,  intermittent stabbing/contant aching pain at R lateral trunk near ribs 8-10 that occasionally radiates anteriorly or towards spine and irritated with movement of this region in ipsilateral sidebending, rotation, or spine extension leading to the following functional deficits: any activity that requires moving the trunk in rotation, sidebending, extension including unable to do yoga (legs up the wall), laying on the back, raising head and shoulders up from lying on back, wakes her up at night, playing golf (able to play if takes deep breath and holds on back swing and until follow through is over). Continues to have gas when she does twisting and touching there, not exercising like she wants to, cannot do all lower back exercises for her low back pain, bicycles. She can play 1 day of golf and takes 2 days  to recover. Used to play 4 x per week without difficulty. Relevant past medical history and comorbidities include cholecystectomy 05/23/2019, asthma, chronic low back pain with most recent flair up the most severe she has had including L leg numbness to foot, B12 deficiency, splenectomy, L great toe surgery, Left shoulder surgery, hypothyroidism, possible mitral valve prolapse, heredity spherocytosis, anemia.    Limitations  Sitting    How long can you sit comfortably?  sitting okay    How long can you stand comfortably?  limited by low back: 1 hour    How long can you walk comfortably?  able to walk  at least 30 min (knees limiting)    Diagnostic tests  Thoracic MRI  report 10/25/2019: "IMPRESSION: 1. Prominent degenerative endplate spurring along the right anterolateral aspect of the thoracic spine extending from T4-5 through T9-10. Findings could contribute to right-sided pain. 2. Right greater than left facet hypertrophy at T6-7 and T7-8without stenosis. 3. Small left paracentral disc protrusion at T3-4 with minimal flattening of the left hemi cord. No significant stenosis."    Patient Stated Goals  "make the pain go away" and be able to play golf, exercise, do yoga, all that stuff that she was doing before that she could do before it was constant pain. Golf was the main thing she had to give up.    Currently in Pain?  No/denies    Pain Onset  More than a month ago      OBJECTIVE: FOTO = 49   TREATMENT:  Therapeutic exercise:to centralize symptoms and improve ROM, strength, muscular endurance, and activity tolerance required for successful completion of functional activities. - standing trunk/hip horizontal rotation against green theraband 2x10 each side - kneeling trunk horizontal rotation against green theraband, 2x10 each direction with one set each knee up - kneeling trunk rotation with green theraband looped around shoulder 2x10 each side (ipsilateral leg forward, facing anchor, one set with hand behind head, 2nd set hand on hip) - education on quad, hamstring, and glute strengthening for chronic knee pain.  - golf driver swings 0-17 to tolerance.  - education on progress,  POC and HEP  HOME EXERCISE PROGRAM Access Code: [email or web address]  URL: [email or web address]  Date: 11/21/2019  Prepared by: Rosita Kea   Exercises Row with band/cable - 30 reps - 3x daily Circular Shoulder Pendulum with Table Support - 20 reps - 2-3x daily Standing Trunk Rotation with Resistance - 3 sets - 10 reps Seated Trunk Rotation with Anchored Resistance - 3 sets - 10 reps  10/10/2019: Seated mobilization with movement using strap to rotate thoracic  spine R while pulling rib above affected rib cranially. 10-20 every 2 hours or when hurting until next session. Unable to locate adequate handout.    PT Education - 11/21/19 1932    Education Details  intervention purpose/form. POC. HEP    Person(s) Educated  Patient    Methods  Explanation;Demonstration;Tactile cues;Verbal cues    Comprehension  Verbalized understanding;Returned demonstration;Verbal cues required;Tactile cues required       PT Short Term Goals - 09/19/19 1140      PT SHORT TERM GOAL #1   Title  Be independent with initial home exercise program for self-management of symptoms.    Baseline  Initial HEP to be assigned visit 2(09/05/2019);    Time  2    Period  Weeks    Status  Achieved    Target Date  09/19/19        PT Long Term Goals -  11/21/19 1928      PT LONG TERM GOAL #1   Title  Be independent with a long-term home exercise program for self-management of symptoms.    Baseline  Initial HEP to be established visit 2 (09/05/2019); patient currently participating in appropriate HEP (11/07/2019; 11/21/2019);    Time  12    Period  Weeks    Status  Partially Met    Target Date  01/30/20      PT LONG TERM GOAL #2   Title  Reduce pain with functional activities to equal or less than 1/10 to allow patient to complete usual activities including ADLs, IADLs, and social engagement with less difficulty.    Baseline  10/10 (09/05/2019); continues to have high levels of pain at times (11/07/2019); max pain has been 1/10 over the last week (11/21/2019);    Time  12    Period  Weeks    Status  Partially Met    Target Date  12/02/19      PT LONG TERM GOAL #3   Title  Have full thoracic AROM with no compensations or increase in pain in all planes except intermittent end range discomfort to allow patient to complete valued activities with less difficulty.    Baseline  pain with R sidebending, R rotation, and extension (09/05/2019); improved but limited - see objective exam  (11/07/2019);    Time  12    Period  Weeks    Status  Partially Met    Target Date  01/30/20      PT LONG TERM GOAL #4   Title  Complete community, work and/or recreational activities without limitation due to current condition.    Baseline  limiting ability to complete her usual ADLs, IADLs, and social participation including anything that requires motion of the R trunk, yoga, laying on the back, sleeping, crunches, competitive golf, exercises prescribed for her low back pain, being active, exercise program (09/05/2019); continues to have similar limitations but has less belching and has improved participation in golfing slightly (11/07/2019); notes improved tolerance but has not yet returned to all activities (11/21/2019);    Time  12    Period  Weeks    Status  Partially Met    Target Date  01/30/20      PT LONG TERM GOAL #5   Title  Demonstrate improved FOTO score by 10 units to demonstrate improvement in overall condition and self-reported functional ability.    Baseline  to be measured visit 2 (09/05/2019); 44 (09/10/2019); 46 (11/07/2019); 49 (11/21/2019);    Time  12    Period  Weeks    Status  Partially Met    Target Date  01/30/20          Plan - 11/21/19 1928    Clinical Impression Statement  Patient tolerated treatment well overall with some slight discomfort in her right side but tolerable to her. Session focused on trunk strengthening specific to golfing and building tolerance for rotation for patient to work on independently. Patient has attended 10 physical therapy sessions this episode of care. Initially her progress was slow and she continued to have symptoms, so she returned to her referring physician who ordered a thoracic MRI which was negative. She had started to have some success with interventions for slipping rib syndrome prior to going for MRI, so she returned to PT and has responded well to taping. Also reports decreased belching and improved confidence in  self-management of symptoms including using taping and  home stretching routine to improve pain control. She has not yet returned to golfing because of weather issues and has not yet shown long term or complete stability in symptoms. She does continue to have some pain with thoracic R sidebending, rotation, and extension, decreased activity tolerance, ROM deficits, joint stiffness, muscle performance (strength/power/endurance), postural, and activity tolerance impairments that are limiting ability to complete her usual ADLs, IADLs, and social participation including anything that requires motion of the R trunk, yoga, laying on the back, sleeping, crunches, competitive golf, exercises prescribed for her low back pain, being active, exercise program  without difficulty. Patient would benefit from continued management of limiting condition by skilled physical therapist to address remaining impairments and functional limitations to work towards stated goals and return to PLOF or maximal functional independence. Current plan is for patient to continue self-managing during the next few week and come back in for further sessions if she gets stuck in her progress or regression or feels she requires further guidance.    Personal Factors and Comorbidities  Age;Comorbidity 3+;Time since onset of injury/illness/exacerbation;Past/Current Experience   long history of lack of identifiable cause and unsuccessful treatment   Comorbidities  Relevant past medical history and comorbidities include cholecystectomy 05/23/2019, asthma, chronic low back pain with most recent flair up the most severe she has had including L leg numbness to foot, B12 deficiency, splenectomy, L great toe surgery, Left shoulder surgery, hypothyroidism, possible mitral valve prolapse, heredity spherocytosis, anemia.    Examination-Activity Limitations  Sit;Transfers;Bed Mobility;Hygiene/Grooming;Sleep;Bend;Lift;Squat;Adult nurse;Reach  Overhead;Other   anything that requires motion of the R trunk, yoga, laying on the back, sleeping, crunches, competitive golf, exercises prescribed for her low back pain, being active, exercise program   Examination-Participation Restrictions  Interpersonal Relationship;Yard Work;Cleaning;Community Activity   golfing, yoga   Stability/Clinical Decision Making  Evolving/Moderate complexity    Rehab Potential  Fair    PT Frequency  2x / week    PT Duration  6 weeks    PT Treatment/Interventions  ADLs/Self Care Home Management;Aquatic Therapy;Cryotherapy;Moist Heat;Therapeutic activities;Therapeutic exercise;Neuromuscular re-education;Patient/family education;Functional mobility training;Manual techniques;Passive range of motion;Dry needling;Spinal Manipulations;Joint Manipulations    PT Next Visit Plan  MWM, interventions for slipping rib syndrome as needed    PT Home Exercise Plan  Medbridge Access Code: [email or web address]    Consulted and Agree with Plan of Care  Patient       Patient will benefit from skilled therapeutic intervention in order to improve the following deficits and impairments:  Difficulty walking, Increased muscle spasms, Decreased range of motion, Impaired perceived functional ability, Decreased activity tolerance, Decreased strength, Impaired flexibility, Impaired UE functional use, Postural dysfunction, Pain, Decreased endurance, Decreased mobility  Visit Diagnosis: Pain in thoracic spine  Other muscle spasm  Muscle weakness (generalized)     Problem List Patient Active Problem List   Diagnosis Date Noted  . ACUTE SINUSITIS, UNSPECIFIED 10/16/2007  . RASH AND OTHER NONSPECIFIC SKIN ERUPTION 09/26/2007  . GOITER NOS 06/06/2007  . SPHEROCYTOSIS, HEREDITARY 06/06/2007  . MIGRAINE HEADACHE 06/06/2007  . ALLERGIC RHINITIS 06/06/2007  . ASTHMA 06/06/2007  . INTERSTITIAL CYSTITIS 06/06/2007  . LOW BACK PAIN, CHRONIC 06/06/2007  . Personal history of unspecified circulatory  disease 06/06/2007    Everlean Alstrom. Graylon Good, PT, DPT 11/21/19, 7:34 PM  [facility] [address] [place], [address] Phone: [phone]   Fax:  [phone]  Name: Sophia [name] MRN: [id number] Date of Birth: 01/19/1961

## 2019-12-20 ENCOUNTER — Ambulatory Visit: Payer: 59 | Admitting: Student in an Organized Health Care Education/Training Program

## 2020-01-18 ENCOUNTER — Other Ambulatory Visit: Payer: Self-pay | Admitting: Family Medicine

## 2020-01-18 DIAGNOSIS — N950 Postmenopausal bleeding: Secondary | ICD-10-CM

## 2020-02-05 ENCOUNTER — Ambulatory Visit: Payer: 59 | Admitting: Student in an Organized Health Care Education/Training Program

## 2020-02-12 ENCOUNTER — Encounter: Payer: Self-pay | Admitting: Student in an Organized Health Care Education/Training Program

## 2020-02-12 ENCOUNTER — Ambulatory Visit
Payer: 59 | Attending: Student in an Organized Health Care Education/Training Program | Admitting: Student in an Organized Health Care Education/Training Program

## 2020-02-12 ENCOUNTER — Other Ambulatory Visit: Payer: Self-pay

## 2020-02-12 VITALS — BP 147/83 | HR 91 | Temp 98.1°F | Resp 16 | Ht 66.0 in | Wt 194.0 lb

## 2020-02-12 DIAGNOSIS — G8929 Other chronic pain: Secondary | ICD-10-CM | POA: Insufficient documentation

## 2020-02-12 DIAGNOSIS — G588 Other specified mononeuropathies: Secondary | ICD-10-CM | POA: Diagnosis not present

## 2020-02-12 DIAGNOSIS — G894 Chronic pain syndrome: Secondary | ICD-10-CM | POA: Insufficient documentation

## 2020-02-12 DIAGNOSIS — R0789 Other chest pain: Secondary | ICD-10-CM | POA: Diagnosis not present

## 2020-02-12 NOTE — Progress Notes (Signed)
Patient: Sophia [name]  Service Category: E/M  Provider: Gillis Santa, MD  DOB: 10/05/1961  DOS: 02/12/2020  Referring Provider: Harvest Dark, FNP  MRN: [id number]  Setting: Ambulatory outpatient  PCP: Hortencia Pilar, MD  Type: New Patient  Specialty: Interventional Pain Management    Location: Office  Delivery: Face-to-face     Primary Reason(s) for Visit: Encounter for initial evaluation of one or more chronic problems (new to examiner) potentially causing chronic pain, and posing a threat to normal musculoskeletal function. (Level of risk: High) CC: Pain (right rib area laterally since 2003)  HPI  Sophia [name] is a 59 y.o. year old, female patient, who comes today to see [place] for the first time for an initial evaluation of her chronic pain. She has GOITER NOS; SPHEROCYTOSIS, HEREDITARY; MIGRAINE HEADACHE; ACUTE SINUSITIS, UNSPECIFIED; ALLERGIC RHINITIS; ASTHMA; INTERSTITIAL CYSTITIS; LOW BACK PAIN, CHRONIC; RASH AND OTHER NONSPECIFIC SKIN ERUPTION; Personal history of unspecified circulatory disease; Intercostal neuralgia; Chronic chest wall pain; and Chronic pain syndrome on their problem list. Today she comes in for evaluation of her Pain (right rib area laterally since 2003)  Pain Assessment: Location: Right, Lateral Rib cage Radiating:  Stays in the right subcostal region Onset: More than a month ago(2003) Duration:  Present throughout the day Quality: Stabbing Severity: 8 /10 (subjective, self-reported pain score)  Note: Reported level is inconsistent with clinical observations.                         When using our objective Pain Scale, levels between 6 and 10/10 are said to belong in an emergency room, as it progressively worsens from a 6/10, described as severely limiting, requiring emergency care not usually available at an outpatient pain management facility. At a 6/10 level, communication becomes difficult and requires great effort. Assistance to reach the emergency department may  be required. Facial flushing and profuse sweating along with potentially dangerous increases in heart rate and blood pressure will be evident. Effect on ADL:  Limits ability to play golf or band Timing: Intermittent Modifying factors: Burping, topicals BP: (!) 147/83  HR: 91  Onset and Duration: Date of onset: 2003 Cause of pain: Unknown Severity: Getting worse, NAS-11 at its worse: 8/10, NAS-11 at its best: 0/10, NAS-11 now: 2/10 and NAS-11 on the average: 4/10 Timing: Not influenced by the time of the day, During activity or exercise and After activity or exercise Aggravating Factors: Bending, Motion and Twisting Alleviating Factors: Stretching and Medications Associated Problems: Pain that wakes patient up Quality of Pain: Deep, Sharp, Stabbing and Tender Previous Examinations or Tests: CT scan, Endoscopy, MRI scan, X-rays, Orthopedic evaluation and Chiropractic evaluation Previous Treatments: Chiropractic manipulations, Physical Therapy and Stretching exercises  The patient comes into the clinics today for the first time for a chronic pain management evaluation.   Patient is a pleasant 59 year old female who presents with a chief complaint of right lateral intercostal pain.  This is been present for many years.  She states that this started in 2014 while she was playing golf and riding in a golf cart and felt pain in the right lateral rib cage in her subcostal region.  She associates this with burping as well.  She has had an extensive GI work-up and spinal work-up regarding this pain.  She had a cholecystectomy performed in August 2020.  She also has a history of a splenectomy.  Any sort of torsional rotation can cause an increase in her pain.  Thoracic MRI was largely unremarkable.  She has done physical therapy as well as chiropractic and ablation to help out with this pain.  She is not interested in medication management and does only utilize ibuprofen and Tylenol on a as needed basis.   From the patient's GI work-up, anterior cutaneous nerve entrapment was suggested as a possible differential.  She states that she can feel her rib pop out at times.  She has been working with physical therapy and does home stretching exercises.  PMP reviewed and appropriate.  Meds   Current Outpatient Medications:  .  acetaminophen (TYLENOL) 325 MG tablet, Take 650 mg by mouth as needed., Disp: , Rfl:  .  fluticasone (FLONASE) 50 MCG/ACT nasal spray, Place 2 sprays into the nose as needed.  , Disp: , Rfl:  .  levothyroxine (SYNTHROID, LEVOTHROID) 50 MCG tablet, Take 88 mcg by mouth daily before breakfast. , Disp: , Rfl:  .  Multiple Vitamins-Minerals (MULTIVITAMIN WITH MINERALS) tablet, Take 1 tablet by mouth daily., Disp: , Rfl:  .  omeprazole (PRILOSEC) 20 MG capsule, Take 20 mg by mouth daily., Disp: , Rfl:  .  trolamine salicylate (ASPERCREME) 10 % cream, Apply 1 application topically as needed for muscle pain., Disp: , Rfl:  .  levalbuterol (XOPENEX HFA) 45 MCG/ACT inhaler, Inhale 1 puff into the lungs every 4 (four) hours as needed for wheezing., Disp: , Rfl:  .  Omega-3 Fatty Acids (FISH OIL CONCENTRATE PO), Take 120 mg by mouth 3 (three) times daily.  , Disp: , Rfl:   Imaging Review   Thoracic Imaging: Thoracic MR wo contrast:  Results for orders placed during the hospital encounter of 10/24/19  MR THORACIC SPINE WO CONTRAST   Narrative CLINICAL DATA:  Initial evaluation for pain to right side of the mid thoracic back and right ribs, approximately ribs 8 through 10.  EXAM: MRI THORACIC SPINE WITHOUT CONTRAST  TECHNIQUE: Multiplanar, multisequence MR imaging of the thoracic spine was performed. No intravenous contrast was administered.  COMPARISON:  None available.  FINDINGS: Alignment: Vertebral bodies normally aligned with preservation of the normal thoracic kyphosis. No listhesis.  Vertebrae: Vertebral body height maintained without evidence for acute or chronic  fracture. Bone marrow signal intensity within normal limits. Few scattered benign hemangiomata noted. No other discrete or worrisome osseous lesions. No abnormal marrow edema.  Cord: Signal intensity within the thoracic spinal cord is normal. Normal cord caliber morphology.  Paraspinal and other soft tissues: Paraspinous soft tissues within normal limits. Visualized lungs are grossly clear. Visualized visceral structures unremarkable.  Disc levels:  T1-2:  Unremarkable.  T2-3: Unremarkable.  T3-4: Mild reactive endplate changes anteriorly. Small left paracentral disc protrusion mildly indents the left ventral thecal sac (series 20, image 11). Mild flattening of left hemicord without significant stenosis. No cord signal changes.  T4-5: Mild reactive endplate changes anteriorly. No significant disc bulge. No stenosis.  T5-6:  Anterior endplate spurring.  Otherwise unremarkable.  T6-7: Degenerative endplate spurring along the right anterolateral aspect of the thoracic spine. Right greater than left facet hypertrophy. No significant stenosis.  T7-8: Degenerative spurring along the right anterolateral aspect of the thoracic spine. Mild right-sided facet hypertrophy. No stenosis.  T8-9: Degenerative spurring along the right anterolateral aspect of the thoracic spine. Otherwise unremarkable.  T9-10: Prominent spurring along the right anterolateral aspect of the thoracic spine. Otherwise unremarkable.  T10-11:  Unremarkable.  T11-12:  Unremarkable.  T12-L1:  Unremarkable.  IMPRESSION: 1. Prominent degenerative endplate spurring along the right anterolateral  aspect of the thoracic spine extending from T4-5 through T9-10. Findings could contribute to right-sided pain. 2. Right greater than left facet hypertrophy at T6-7 and T7-8 without stenosis. 3. Small left paracentral disc protrusion at T3-4 with minimal flattening of the left hemi cord. No significant  stenosis.   Electronically Signed   By: Jeannine Boga M.D.   On: 10/25/2019 04:48      Complexity Note: Imaging results reviewed. Results shared with Sophia [name], using Layman's terms. Today I personally and independently reviewed the study images pertinent to Sophia [name]'s problem.                  ROS  Cardiovascular: No reported cardiovascular signs or symptoms such as High blood pressure, coronary artery disease, abnormal heart rate or rhythm, heart attack, blood thinner therapy or heart weakness and/or failure Pulmonary or Respiratory: Wheezing and difficulty taking a deep full breath (Asthma) Neurological: No reported neurological signs or symptoms such as seizures, abnormal skin sensations, urinary and/or fecal incontinence, being born with an abnormal open spine and/or a tethered spinal cord Psychological-Psychiatric: No reported psychological or psychiatric signs or symptoms such as difficulty sleeping, anxiety, depression, delusions or hallucinations (schizophrenial), mood swings (bipolar disorders) or suicidal ideations or attempts Gastrointestinal: No reported gastrointestinal signs or symptoms such as vomiting or evacuating blood, reflux, heartburn, alternating episodes of diarrhea and constipation, inflamed or scarred liver, or pancreas or irrregular and/or infrequent bowel movements Genitourinary: No reported renal or genitourinary signs or symptoms such as difficulty voiding or producing urine, peeing blood, non-functioning kidney, kidney stones, difficulty emptying the bladder, difficulty controlling the flow of urine, or chronic kidney disease Hematological: No reported hematological signs or symptoms such as prolonged bleeding, low or poor functioning platelets, bruising or bleeding easily, hereditary bleeding problems, low energy levels due to low hemoglobin or being anemic Endocrine: Slow thyroid Rheumatologic: No reported rheumatological signs and symptoms such as  fatigue, joint pain, tenderness, swelling, redness, heat, stiffness, decreased range of motion, with or without associated rash Musculoskeletal: Negative for myasthenia gravis, muscular dystrophy, multiple sclerosis or malignant hyperthermia Work History: Retired  Allergies  Sophia [name] is allergic to contrast media [iodinated diagnostic agents]; codeine; phenergan [promethazine hcl]; sulfamethoxazole-trimethoprim; tequin [gatifloxacin]; and latex.  Laboratory Chemistry Profile   Renal Lab Results  Component Value Date   BUN 10 12/16/2016   CREATININE 0.80 12/16/2016   GFRAA >60 12/16/2016   GFRNONAA >60 12/16/2016     Electrolytes Lab Results  Component Value Date   NA 141 12/16/2016   K 3.6 12/16/2016   CL 106 12/16/2016   CALCIUM 8.8 (L) 12/16/2016     Hepatic Lab Results  Component Value Date   AST 34 12/16/2016   ALT 34 12/16/2016   ALBUMIN 4.0 12/16/2016   ALKPHOS 108 12/16/2016     ID No results found for: LYMEIGGIGMAB, HIV, SARSCOV2NAA, STAPHAUREUS, MRSAPCR, HCVAB, PREGTESTUR, RMSFIGG, QFVRPH1IGG, QFVRPH2IGG, LYMEIGGIGMAB   Bone No results found for: VD25OH, SW109NA3FTD, DU2025KY7, CW2376EG3, 25OHVITD1, 25OHVITD2, 25OHVITD3, TESTOFREE, TESTOSTERONE   Endocrine Lab Results  Component Value Date   GLUCOSE 106 (H) 12/16/2016     Neuropathy No results found for: VITAMINB12, FOLATE, HGBA1C, HIV   CNS No results found for: COLORCSF, APPEARCSF, RBCCOUNTCSF, WBCCSF, POLYSCSF, LYMPHSCSF, EOSCSF, PROTEINCSF, GLUCCSF, JCVIRUS, CSFOLI, IGGCSF, LABACHR, ACETBL, LABACHR, ACETBL   Inflammation (CRP: Acute  ESR: Chronic) No results found for: CRP, ESRSEDRATE, LATICACIDVEN   Rheumatology No results found for: RF, ANA, LABURIC, URICUR, LYMEIGGIGMAB, LYMEABIGMQN, HLAB27   Coagulation Lab  Results  Component Value Date   PLT 576 (H) 12/16/2016     Cardiovascular Lab Results  Component Value Date   TROPONINI <0.03 12/17/2016   HGB 14.8 12/16/2016   HCT 40.1  12/16/2016     Screening No results found for: SARSCOV2NAA, COVIDSOURCE, STAPHAUREUS, MRSAPCR, HCVAB, HIV, PREGTESTUR   Cancer No results found for: CEA, CA125, LABCA2   Allergens No results found for: ALMOND, APPLE, ASPARAGUS, AVOCADO, BANANA, BARLEY, BASIL, BAYLEAF, GREENBEAN, LIMABEAN, WHITEBEAN, BEEFIGE, REDBEET, BLUEBERRY, BROCCOLI, CABBAGE, MELON, CARROT, CASEIN, CASHEWNUT, CAULIFLOWER, CELERY     Note: Lab results reviewed.   PFSH  Drug: Sophia [name]  reports no history of drug use. Alcohol:  reports no history of alcohol use. Tobacco:  reports that she has never smoked. She has never used smokeless tobacco. Medical:  has a past medical history of Allergic rhinitis, Asthma, mild intermittent, Mitral valve prolapse, and Thyroid disease. Family: family history includes Breast cancer in an other family member; Colon cancer in her paternal grandmother; Coronary artery disease in her mother; Diabetes in her mother; Heart failure in her mother; Hypertension in her father; Obesity in her mother; Parkinsonism in her maternal grandfather; Stroke in her maternal grandmother, paternal grandfather, and paternal grandmother.  Past Surgical History:  Procedure Laterality Date  . SHOULDER SURGERY  1990s   Left  . SPLENECTOMY  1978  . TOE SURGERY     Active Ambulatory Problems    Diagnosis Date Noted  . GOITER NOS 06/06/2007  . SPHEROCYTOSIS, HEREDITARY 06/06/2007  . MIGRAINE HEADACHE 06/06/2007  . ACUTE SINUSITIS, UNSPECIFIED 10/16/2007  . ALLERGIC RHINITIS 06/06/2007  . ASTHMA 06/06/2007  . INTERSTITIAL CYSTITIS 06/06/2007  . LOW BACK PAIN, CHRONIC 06/06/2007  . RASH AND OTHER NONSPECIFIC SKIN ERUPTION 09/26/2007  . Personal history of unspecified circulatory disease 06/06/2007  . Intercostal neuralgia 02/12/2020  . Chronic chest wall pain 02/12/2020  . Chronic pain syndrome 02/12/2020   Resolved Ambulatory Problems    Diagnosis Date Noted  . No Resolved Ambulatory Problems    Past Medical History:  Diagnosis Date  . Allergic rhinitis   . Asthma, mild intermittent   . Mitral valve prolapse   . Thyroid disease    Constitutional Exam  General appearance: Well nourished, well developed, and well hydrated. In no apparent acute distress Vitals:   02/12/20 1045  BP: (!) 147/83  Pulse: 91  Resp: 16  Temp: 98.1 F (36.7 C)  TempSrc: Oral  SpO2: 100%  Weight: 194 lb (88 kg)  Height: _0  (1.676 m)   BMI Assessment: Estimated body mass index is 31.31 kg/m as calculated from the following:   Height as of this encounter: _1  (1.676 m).   Weight as of this encounter: 194 lb (88 kg).  BMI interpretation table: BMI level Category Range association with higher incidence of chronic pain  <18 kg/m2 Underweight   18.5-24.9 kg/m2 Ideal body weight   25-29.9 kg/m2 Overweight Increased incidence by 20%  30-34.9 kg/m2 Obese (Class I) Increased incidence by 68%  35-39.9 kg/m2 Severe obesity (Class II) Increased incidence by 136%  >40 kg/m2 Extreme obesity (Class III) Increased incidence by 254%   Patient's current BMI Ideal Body weight  Body mass index is 31.31 kg/m. Ideal body weight: 59.3 kg (130 lb 11.7 oz) Adjusted ideal body weight: 70.8 kg (156 lb 0.6 oz)   BMI Readings from Last 4 Encounters:  02/12/20 31.31 kg/m  12/23/16 32.77 kg/m  12/16/16 31.47 kg/m  02/25/15 29.54 kg/m   Wt  Readings from Last 4 Encounters:  02/12/20 194 lb (88 kg)  12/23/16 203 lb (92.1 kg)  12/16/16 195 lb (88.5 kg)  02/25/15 183 lb (83 kg)    Psych/Mental status: Alert, oriented x 3 (person, place, & time)       Eyes: PERLA Respiratory: No evidence of acute respiratory distress  Cervical Spine Exam  Skin & Axial Inspection: No masses, redness, edema, swelling, or associated skin lesions Alignment: Symmetrical Functional ROM: Unrestricted ROM      Stability: No instability detected Muscle Tone/Strength: Functionally intact. No obvious neuro-muscular anomalies  detected. Sensory (Neurological): Unimpaired Palpation: No palpable anomalies              Upper Extremity (UE) Exam    Side: Right upper extremity  Side: Left upper extremity  Skin & Extremity Inspection: Skin color, temperature, and hair growth are WNL. No peripheral edema or cyanosis. No masses, redness, swelling, asymmetry, or associated skin lesions. No contractures.  Skin & Extremity Inspection: Skin color, temperature, and hair growth are WNL. No peripheral edema or cyanosis. No masses, redness, swelling, asymmetry, or associated skin lesions. No contractures.  Functional ROM: Unrestricted ROM          Functional ROM: Unrestricted ROM          Muscle Tone/Strength: Functionally intact. No obvious neuro-muscular anomalies detected.  Muscle Tone/Strength: Functionally intact. No obvious neuro-muscular anomalies detected.  Sensory (Neurological): Unimpaired          Sensory (Neurological): Unimpaired          Palpation: No palpable anomalies              Palpation: No palpable anomalies              Provocative Test(s):  Phalen's test: deferred Tinel's test: deferred Apley's scratch test (touch opposite shoulder):  Action 1 (Across chest): deferred Action 2 (Overhead): deferred Action 3 (LB reach): deferred   Provocative Test(s):  Phalen's test: deferred Tinel's test: deferred Apley's scratch test (touch opposite shoulder):  Action 1 (Across chest): deferred Action 2 (Overhead): deferred Action 3 (LB reach): deferred    Intercostal, subcostal region, tender to palpation along lateral rib, likely T8-T9 distribution no erythema, discoloration, swelling, or allodynia noted.  No significant pain upon superficial palpation.  Thoracic Spine Area Exam  Skin & Axial Inspection: No masses, redness, or swelling Alignment: Symmetrical Functional ROM: Unrestricted ROM Stability: No instability detected Muscle Tone/Strength: Functionally intact. No obvious neuro-muscular anomalies  detected. Sensory (Neurological): Unimpaired Muscle strength & Tone: No palpable anomalies  Lumbar Exam  Skin & Axial Inspection: No masses, redness, or swelling Alignment: Symmetrical Functional ROM: Unrestricted ROM       Stability: No instability detected Muscle Tone/Strength: Functionally intact. No obvious neuro-muscular anomalies detected. Sensory (Neurological): Unimpaired Palpation: No palpable anomalies       Provocative Tests: Hyperextension/rotation test: deferred today       Lumbar quadrant test (Kemp's test): deferred today       Lateral bending test: deferred today       Patrick's Maneuver: deferred today                   FABER* test: deferred today                   S-I anterior distraction/compression test: deferred today         S-I lateral compression test: deferred today         S-I Thigh-thrust test: deferred today  S-I Gaenslen's test: deferred today         *(Flexion, ABduction and External Rotation)  Gait & Posture Assessment  Ambulation: Unassisted Gait: Relatively normal for age and body habitus Posture: WNL   Lower Extremity Exam    Side: Right lower extremity  Side: Left lower extremity  Stability: No instability observed          Stability: No instability observed          Skin & Extremity Inspection: Skin color, temperature, and hair growth are WNL. No peripheral edema or cyanosis. No masses, redness, swelling, asymmetry, or associated skin lesions. No contractures.  Skin & Extremity Inspection: Skin color, temperature, and hair growth are WNL. No peripheral edema or cyanosis. No masses, redness, swelling, asymmetry, or associated skin lesions. No contractures.  Functional ROM: Unrestricted ROM                  Functional ROM: Unrestricted ROM                  Muscle Tone/Strength: Functionally intact. No obvious neuro-muscular anomalies detected.  Muscle Tone/Strength: Functionally intact. No obvious neuro-muscular anomalies detected.   Sensory (Neurological): Unimpaired        Sensory (Neurological): Unimpaired        DTR: Patellar: deferred today Achilles: deferred today Plantar: deferred today  DTR: Patellar: deferred today Achilles: deferred today Plantar: deferred today  Palpation: No palpable anomalies  Palpation: No palpable anomalies   Assessment  Primary Diagnosis & Pertinent Problem List: The primary encounter diagnosis was Intercostal neuralgia. Diagnoses of Chronic chest wall pain and Chronic pain syndrome were also pertinent to this visit.  Visit Diagnosis (New problems to examiner): 1. Intercostal neuralgia   2. Chronic chest wall pain   3. Chronic pain syndrome    Patient has had an extensive GI work-up.  Do not believe this is anterior cutaneous nerve entrapment syndrome as her pain distribution is in her subcostal region.  Furthermore I do not think this is coming from a thoracic spinal source as it does not radiate in a posterior distribution to the anterior chest wall.  This could be related to intercostal neuralgia given her pain complaints.  Discussed ultrasound-guided and fluoroscopy guided intercostal nerve block.  Risks and benefits reviewed and patient like to proceed.  I did offer the patient a muscle relaxer to help out with any intercostal muscle spasms and also gabapentin that she could take at night for symptomatic pain relief.  Patient was not interested and declined both.  We will proceed with diagnostic right intercostal nerve block.  I have encouraged her to continue her home physical therapy exercises.   1. Intercostal neuralgia - INTERCOSTAL NERVE BLOCK; Future -Patient declined gabapentin and/or muscle relaxer trial   Plan of Care (Initial workup plan)   Procedure Orders     INTERCOSTAL NERVE BLOCK   Interventional management options: Sophia [name] was informed that there is no guarantee that she would be a candidate for interventional therapies. The decision will be based on the  results of diagnostic studies, as well as Sophia [name]'s risk profile.  Procedure(s) under consideration:  Right diagnostic intercostal nerve block under ultrasound guidance and fluoroscopy   Provider-requested follow-up: Return in about 2 weeks (around 02/26/2020) for R ICNB (T7/8) anterior w [place].  No future appointments.  Note by: Gillis Santa, MD Date: 02/12/2020; Time: 2:18 PM

## 2020-02-12 NOTE — Progress Notes (Signed)
Safety precautions to be maintained throughout the outpatient stay will include: orient to surroundings, keep bed in low position, maintain call bell within reach at all times, provide assistance with transfer out of bed and ambulation.  

## 2020-02-12 NOTE — Patient Instructions (Signed)
GENERAL RISKS AND COMPLICATIONS  What are the risk, side effects and possible complications? Generally speaking, most procedures are safe.  However, with any procedure there are risks, side effects, and the possibility of complications.  The risks and complications are dependent upon the sites that are lesioned, or the type of nerve block to be performed.  The closer the procedure is to the spine, the more serious the risks are.  Great care is taken when placing the radio frequency needles, block needles or lesioning probes, but sometimes complications can occur. 1. Infection: Any time there is an injection through the skin, there is a risk of infection.  This is why sterile conditions are used for these blocks.  There are four possible types of infection. 1. Localized skin infection. 2. Central Nervous System Infection-This can be in the form of Meningitis, which can be deadly. 3. Epidural Infections-This can be in the form of an epidural abscess, which can cause pressure inside of the spine, causing compression of the spinal cord with subsequent paralysis. This would require an emergency surgery to decompress, and there are no guarantees that the patient would recover from the paralysis. 4. Discitis-This is an infection of the intervertebral discs.  It occurs in about 1% of discography procedures.  It is difficult to treat and it may lead to surgery.        2. Pain: the needles have to go through skin and soft tissues, will cause soreness.       3. Damage to internal structures:  The nerves to be lesioned may be near blood vessels or    other nerves which can be potentially damaged.       4. Bleeding: Bleeding is more common if the patient is taking blood thinners such as  aspirin, Coumadin, Ticiid, Plavix, etc., or if he/she have some genetic predisposition  such as hemophilia. Bleeding into the spinal canal can cause compression of the spinal  cord with subsequent paralysis.  This would require an  emergency surgery to  decompress and there are no guarantees that the patient would recover from the  paralysis.       5. Pneumothorax:  Puncturing of a lung is a possibility, every time a needle is introduced in  the area of the chest or upper back.  Pneumothorax refers to free air around the  collapsed lung(s), inside of the thoracic cavity (chest cavity).  Another two possible  complications related to a similar event would include: Hemothorax and Chylothorax.   These are variations of the Pneumothorax, where instead of air around the collapsed  lung(s), you may have blood or chyle, respectively.       6. Spinal headaches: They may occur with any procedures in the area of the spine.       7. Persistent CSF (Cerebro-Spinal Fluid) leakage: This is a rare problem, but may occur  with prolonged intrathecal or epidural catheters either due to the formation of a fistulous  track or a dural tear.       8. Nerve damage: By working so close to the spinal cord, there is always a possibility of  nerve damage, which could be as serious as a permanent spinal cord injury with  paralysis.       9. Death:  Although rare, severe deadly allergic reactions known as "Anaphylactic  reaction" can occur to any of the medications used.      10. Worsening of the symptoms:  We can always make thing worse.    What are the chances of something like this happening? Chances of any of this occuring are extremely low.  By statistics, you have more of a chance of getting killed in a motor vehicle accident: while driving to the hospital than any of the above occurring .  Nevertheless, you should be aware that they are possibilities.  In general, it is similar to taking a shower.  Everybody knows that you can slip, hit your head and get killed.  Does that mean that you should not shower again?  Nevertheless always keep in mind that statistics do not mean anything if you happen to be on the wrong side of them.  Even if a procedure has a 1  (one) in a 1,000,000 (million) chance of going wrong, it you happen to be that one..Also, keep in mind that by statistics, you have more of a chance of having something go wrong when taking medications.  Who should not have this procedure? If you are on a blood thinning medication (e.g. Coumadin, Plavix, see list of "Blood Thinners"), or if you have an active infection going on, you should not have the procedure.  If you are taking any blood thinners, please inform your physician.  How should I prepare for this procedure?  Do not eat or drink anything at least six hours prior to the procedure.  Bring a driver with you .  It cannot be a taxi.  Come accompanied by an adult that can drive you back, and that is strong enough to help you if your legs get weak or numb from the local anesthetic.  Take all of your medicines the morning of the procedure with just enough water to swallow them.  If you have diabetes, make sure that you are scheduled to have your procedure done first thing in the morning, whenever possible.  If you have diabetes, take only half of your insulin dose and notify our nurse that you have done so as soon as you arrive at the clinic.  If you are diabetic, but only take blood sugar pills (oral hypoglycemic), then do not take them on the morning of your procedure.  You may take them after you have had the procedure.  Do not take aspirin or any aspirin-containing medications, at least eleven (11) days prior to the procedure.  They may prolong bleeding.  Wear loose fitting clothing that may be easy to take off and that you would not mind if it got stained with Betadine or blood.  Do not wear any jewelry or perfume  Remove any nail coloring.  It will interfere with some of our monitoring equipment.  NOTE: Remember that this is not meant to be interpreted as a complete list of all possible complications.  Unforeseen problems may occur.  BLOOD THINNERS The following drugs  contain aspirin or other products, which can cause increased bleeding during surgery and should not be taken for 2 weeks prior to and 1 week after surgery.  If you should need take something for relief of minor pain, you may take acetaminophen which is found in Tylenol,m Datril, Anacin-3 and Panadol. It is not blood thinner. The products listed below are.  Do not take any of the products listed below in addition to any listed on your instruction sheet.  A.P.C or A.P.C with Codeine Codeine Phosphate Capsules #3 Ibuprofen Ridaura  ABC compound Congesprin Imuran rimadil  Advil Cope Indocin Robaxisal  Alka-Seltzer Effervescent Pain Reliever and Antacid Coricidin or Coricidin-D  Indomethacin Rufen    Alka-Seltzer plus Cold Medicine Cosprin Ketoprofen S-A-C Tablets  Anacin Analgesic Tablets or Capsules Coumadin Korlgesic Salflex  Anacin Extra Strength Analgesic tablets or capsules CP-2 Tablets Lanoril Salicylate  Anaprox Cuprimine Capsules Levenox Salocol  Anexsia-D Dalteparin Magan Salsalate  Anodynos Darvon compound Magnesium Salicylate Sine-off  Ansaid Dasin Capsules Magsal Sodium Salicylate  Anturane Depen Capsules Marnal Soma  APF Arthritis pain formula Dewitt's Pills Measurin Stanback  Argesic Dia-Gesic Meclofenamic Sulfinpyrazone  Arthritis Bayer Timed Release Aspirin Diclofenac Meclomen Sulindac  Arthritis pain formula Anacin Dicumarol Medipren Supac  Analgesic (Safety coated) Arthralgen Diffunasal Mefanamic Suprofen  Arthritis Strength Bufferin Dihydrocodeine Mepro Compound Suprol  Arthropan liquid Dopirydamole Methcarbomol with Aspirin Synalgos  ASA tablets/Enseals Disalcid Micrainin Tagament  Ascriptin Doan's Midol Talwin  Ascriptin A/D Dolene Mobidin Tanderil  Ascriptin Extra Strength Dolobid Moblgesic Ticlid  Ascriptin with Codeine Doloprin or Doloprin with Codeine Momentum Tolectin  Asperbuf Duoprin Mono-gesic Trendar  Aspergum Duradyne Motrin or Motrin IB Triminicin  Aspirin  plain, buffered or enteric coated Durasal Myochrisine Trigesic  Aspirin Suppositories Easprin Nalfon Trillsate  Aspirin with Codeine Ecotrin Regular or Extra Strength Naprosyn Uracel  Atromid-S Efficin Naproxen Ursinus  Auranofin Capsules Elmiron Neocylate Vanquish  Axotal Emagrin Norgesic Verin  Azathioprine Empirin or Empirin with Codeine Normiflo Vitamin E  Azolid Emprazil Nuprin Voltaren  Bayer Aspirin plain, buffered or children's or timed BC Tablets or powders Encaprin Orgaran Warfarin Sodium  Buff-a-Comp Enoxaparin Orudis Zorpin  Buff-a-Comp with Codeine Equegesic Os-Cal-Gesic   Buffaprin Excedrin plain, buffered or Extra Strength Oxalid   Bufferin Arthritis Strength Feldene Oxphenbutazone   Bufferin plain or Extra Strength Feldene Capsules Oxycodone with Aspirin   Bufferin with Codeine Fenoprofen Fenoprofen Pabalate or Pabalate-SF   Buffets II Flogesic Panagesic   Buffinol plain or Extra Strength Florinal or Florinal with Codeine Panwarfarin   Buf-Tabs Flurbiprofen Penicillamine   Butalbital Compound Four-way cold tablets Penicillin   Butazolidin Fragmin Pepto-Bismol   Carbenicillin Geminisyn Percodan   Carna Arthritis Reliever Geopen Persantine   Carprofen Gold's salt Persistin   Chloramphenicol Goody's Phenylbutazone   Chloromycetin Haltrain Piroxlcam   Clmetidine heparin Plaquenil   Cllnoril Hyco-pap Ponstel   Clofibrate Hydroxy chloroquine Propoxyphen         Before stopping any of these medications, be sure to consult the physician who ordered them.  Some, such as Coumadin (Warfarin) are ordered to prevent or treat serious conditions such as "deep thrombosis", "pumonary embolisms", and other heart problems.  The amount of time that you may need off of the medication may also vary with the medication and the reason for which you were taking it.  If you are taking any of these medications, please make sure you notify your pain physician before you undergo any  procedures.         Intercostal Nerve Block Patient Information  Description: The twelve intercostal nerves arise from the first thru twelfth thoracic nerve roots.  The nerve begins at the spine and wraps around the body, lying in a groove underneath each rib.  Each intercostal nerve innervates a specific strip of skin and body walk of the abdomen and chest.  Therefore, injuries of the chest wall or abdominal wall result in pain that is transmitted back to the brian via the intercostal nerves.  Examples of such injuries include rib fractures and incisions for lung and gall bladder surgery.  Occasionally, pain may persist long after an injury or surgical incision secondary to inflammation and irritation of the intercostal nerve.  The longstanding pain is known  as intercostal neuralgia.  An intercostal nerve block is preformed to eliminate pain either temporarily or permanently.  A small needle is placed below the rib and local anesthetic (like Novocaine) and possibly steroid is injected.  Usually 2-4 intercostal nerves are blocked at a time depending on the problem.  The patient will experience a slight "pin-prick" sensation for each injection.  Shortly thereafter, the strip of skin that is innervated by the blocked intercostal nerve will feel numb.  Persistent pain that is only temporarily relieved with local anesthetic may require a more permanent block. This procedure is called Cryoneurolysis and entails placing a small probe beneath the rib to freeze the nerve.  Conditions that may be treated by intercostal nerve blocks:   Rib fractures  Longstanding pain from surgery of the chest or abdomen (intercostal neuralgia)  Pain from chest tubes  Pain from trauma to the chest  Preparation for the injections:  1. Do not eat any solid food or dairy products within 8 hours of your appointment. 2. You may drink clear liquids up to 3 hours before appointment.  Clear liquids include water, black  coffee, juice or soda.  No milk or cream please. 3. You may take your regular medication, including pain medications, with a sip of water before your appointment.  Diabetics should hold regular insulin (if take separately) and take 1/2 normal NPH dose the morning of the procedure.   Carry some sugar containing items with you to your appointment. 4. A driver must accompany you and be prepared to drive you home after your procedure. 5. Bring all your current medications with you. 6. An IV may be inserted and sedation may be given at the discretion of the physician. 7. A blood pressure cuff, EKG and other monitors will often be applied during the procedure.  Some patients may need to have extra oxygen administered for a short period. 8. You will be asked to provide medical information, including your allergies, prior to the procedure.  We must know immediately if you are taking blood thinners (like Coumadin/Warfarin) or if you are allergic to IV iodine contrast (dye). We must know if you could possible be pregnant.  Possible side-effects:   Bleeding from needle site  Infection (rare)  Nerve injury (rare)  Numbness & tingling of skin  Collapsed lung requiring chest tube (rare)  Local anesthetic toxicity (rare)  Light-headedness (temporary)  Pain at injection site (several days)  Decreased blood pressure (temporary)  Shortness of breath  Jittery/shaking sensation (temporary)  Call if you experience:   Difficulty breathing or hives (go directly to the emergency room)  Redness, inflammation or drainage at the injection site  Severe pain at the site of the injection  Any new symptoms which are concerning   Please note:  Your pain may subside immediately but may return several hours after the injection.  Often, more than one injection is required to reduce the pain. Also, if several temporary blocks with local anesthetic are ineffective, a more permanent block with cryolysis may  be necessary.  This will be discussed with you should this be the case.  If you have any questions, please call [phone] [facility]    Pain Management Discharge Instructions  General Discharge Instructions :  If you need to reach your doctor call: Monday-Friday 8:00 am - 4:00 pm at [phone] or toll free [phone].  After clinic hours [phone] to have operator reach doctor.  Bring all of your medication bottles  to all your appointments in the pain clinic.  To cancel or reschedule your appointment with Pain Management please remember to call 24 hours in advance to avoid a fee.  Refer to the educational materials which you have been given on: General Risks, I had my Procedure. Discharge Instructions, Post Sedation.  Post Procedure Instructions:  The drugs you were given will stay in your system until tomorrow, so for the next 24 hours you should not drive, make any legal decisions or drink any alcoholic beverages.  You may eat anything you prefer, but it is better to start with liquids then soups and crackers, and gradually work up to solid foods.  Please notify your doctor immediately if you have any unusual bleeding, trouble breathing or pain that is not related to your normal pain.  Depending on the type of procedure that was done, some parts of your body may feel week and/or numb.  This usually clears up by tonight or the next day.  Walk with the use of an assistive device or accompanied by an adult for the 24 hours.  You may use ice on the affected area for the first 24 hours.  Put ice in a Ziploc bag and cover with a towel and place against area 15 minutes on 15 minutes off.  You may switch to heat after 24 hours.

## 2020-02-25 ENCOUNTER — Telehealth: Payer: Self-pay | Admitting: Student in an Organized Health Care Education/Training Program

## 2020-02-25 NOTE — Telephone Encounter (Signed)
Patient was seen on 02-12-20 as new patient for Dr. Cherylann Ratel and is scheduled for procedure on 03-03-20. She received a bill for 02-12-20 for recovery room and would like to know why and would like this fixed please.

## 2020-02-25 NOTE — Telephone Encounter (Signed)
Returned call.No answer. Per Eli Phillips we only charged her for the NP visit. LVM for patient to call the billing department for clarify.

## 2020-03-03 ENCOUNTER — Ambulatory Visit
Admission: RE | Admit: 2020-03-03 | Discharge: 2020-03-03 | Disposition: A | Discharge status: Home / Self Care | Payer: 59 | Source: Ambulatory Visit | Attending: Student in an Organized Health Care Education/Training Program | Admitting: Student in an Organized Health Care Education/Training Program

## 2020-03-03 ENCOUNTER — Encounter: Payer: Self-pay | Admitting: Student in an Organized Health Care Education/Training Program

## 2020-03-03 ENCOUNTER — Ambulatory Visit (HOSPITAL_BASED_OUTPATIENT_CLINIC_OR_DEPARTMENT_OTHER): Payer: 59 | Admitting: Student in an Organized Health Care Education/Training Program

## 2020-03-03 ENCOUNTER — Other Ambulatory Visit: Payer: Self-pay

## 2020-03-03 DIAGNOSIS — Z91040 Latex allergy status: Secondary | ICD-10-CM | POA: Diagnosis not present

## 2020-03-03 DIAGNOSIS — Z885 Allergy status to narcotic agent status: Secondary | ICD-10-CM | POA: Diagnosis not present

## 2020-03-03 DIAGNOSIS — Z881 Allergy status to other antibiotic agents status: Secondary | ICD-10-CM | POA: Diagnosis not present

## 2020-03-03 DIAGNOSIS — Z91041 Radiographic dye allergy status: Secondary | ICD-10-CM | POA: Diagnosis not present

## 2020-03-03 DIAGNOSIS — Z888 Allergy status to other drugs, medicaments and biological substances status: Secondary | ICD-10-CM | POA: Diagnosis not present

## 2020-03-03 DIAGNOSIS — G588 Other specified mononeuropathies: Secondary | ICD-10-CM | POA: Insufficient documentation

## 2020-03-03 DIAGNOSIS — Z9081 Acquired absence of spleen: Secondary | ICD-10-CM | POA: Diagnosis not present

## 2020-03-03 MED ORDER — LIDOCAINE HCL 2 % IJ SOLN
INTRAMUSCULAR | Status: AC
  Filled 2020-03-03: qty 20

## 2020-03-03 MED ORDER — IOHEXOL 180 MG/ML  SOLN: 10.0000 mL | Freq: Once | INTRAMUSCULAR | Status: DC

## 2020-03-03 MED ORDER — ROPIVACAINE HCL 2 MG/ML IJ SOLN
9.0000 mL | Freq: Once | INTRAMUSCULAR | Status: AC
  Administered 2020-03-03: 9 mL via PERINEURAL

## 2020-03-03 MED ORDER — DEXAMETHASONE SODIUM PHOSPHATE 10 MG/ML IJ SOLN
10.0000 mg | Freq: Once | INTRAMUSCULAR | Status: AC
  Administered 2020-03-03: 10 mg

## 2020-03-03 MED ORDER — LIDOCAINE HCL 2 % IJ SOLN
20.0000 mL | Freq: Once | INTRAMUSCULAR | Status: AC
  Administered 2020-03-03: 400 mg

## 2020-03-03 MED ORDER — LIDOCAINE HCL URETHRAL/MUCOSAL 2 % EX GEL
CUTANEOUS | Status: AC
  Filled 2020-03-03: qty 5

## 2020-03-03 MED ORDER — ROPIVACAINE HCL 2 MG/ML IJ SOLN
INTRAMUSCULAR | Status: AC
  Filled 2020-03-03: qty 10

## 2020-03-03 MED ORDER — DEXAMETHASONE SODIUM PHOSPHATE 10 MG/ML IJ SOLN
INTRAMUSCULAR | Status: AC
  Filled 2020-03-03: qty 1

## 2020-03-03 NOTE — Progress Notes (Signed)
PROVIDER NOTE: Information contained herein reflects review and annotations entered in association with encounter. Interpretation of such information and data should be left to medically-trained personnel. Information provided to [name] can be located elsewhere in Sophia medical record under "[name] Instructions". Document created using STT-dictation technology, any transcriptional errors that may result from process are unintentional.    [name]: Sophia [name]  Service Category: Procedure  Provider: Edward Jolly, MD  DOB: 01/30/1961  DOS: 03/03/2020  Location: [facility]  MRN: [id number]  Setting: Ambulatory - outpatient  Referring Provider: Edward Jolly, MD  Type: Established [name]  Specialty: Interventional Pain Management  PCP: Rolm Gala, MD   Primary Reason for Visit: Interventional Pain Management Treatment. CC: Back Pain (right upper towards breast)  Procedure:          Anesthesia, Analgesia, Anxiolysis:  Type: Diagnostic Mid-Axillary Line Intercostal  Nerve Block           Region: Mid-Axillary Line  Thoracic Area Level: Right T7,8 Ribs Laterality: Right-Sided  Type: Local Anesthesia  Local Anesthetic: Lidocaine 1-2%  Position: Lateral decubitus, tilted anteriorly (approx. 45 debrees), w/ Sophia painful side up   Indications: 1. Intercostal neuralgia    Pain Score: Pre-procedure: 1 /10 Post-procedure: 1 /10   Pre-op Assessment:  Sophia [name] is a 59 y.o. (year old), female [name], seen today for interventional treatment. She  has a past surgical history that includes Splenectomy (1978); Shoulder surgery (1990s); and Toe Surgery. Sophia [name] has a current medication list which includes Sophia following prescription(s): acetaminophen, fluticasone, levalbuterol, levothyroxine, multivitamin with minerals, omega-3 fatty acids, omeprazole, and trolamine salicylate, and Sophia following Facility-Administered Medications: dexamethasone, iohexol, lidocaine, and ropivacaine  (pf) 2 mg/ml (0.2%). Her primarily concern today is Sophia Back Pain (right upper towards breast)  Initial Vital Signs:  Pulse/HCG Rate: 97  Temp: 97.9 F (36.6 C) Resp: 18 BP: 139/68 SpO2: 100 %  BMI: Estimated body mass index is 31.15 kg/m as calculated from Sophia following:   Height as of this encounter: 5\' 6"  (1.676 m).   Weight as of this encounter: 193 lb (87.5 kg).  Risk Assessment: Allergies: Reviewed. She is allergic to contrast media [iodinated diagnostic agents]; codeine; metoprolol; phenergan [promethazine hcl]; sulfa antibiotics; sulfamethoxazole-trimethoprim; tequin [gatifloxacin]; and latex.  Allergy Precautions: None required Coagulopathies: Reviewed. None identified.  Blood-thinner therapy: None at this time Active Infection(s): Reviewed. None identified. Sophia [name] is afebrile  Site Confirmation: Sophia [name] was asked to confirm Sophia procedure and laterality before marking Sophia site Procedure checklist: Completed Consent: Before Sophia procedure and under Sophia influence of no sedative(s), amnesic(s), or anxiolytics, Sophia [name] was informed of Sophia treatment options, risks and possible complications. To fulfill our ethical and legal obligations, as recommended by Sophia [employer]'s Code of Ethics, I have informed Sophia [name] of my clinical impression; Sophia nature and purpose of Sophia treatment or procedure; Sophia risks, benefits, and possible complications of Sophia intervention; Sophia alternatives, including doing nothing; Sophia risk(s) and benefit(s) of Sophia alternative treatment(s) or procedure(s); and Sophia risk(s) and benefit(s) of doing nothing. Sophia [name] was provided information about Sophia general risks and possible complications associated with Sophia procedure. These may include, but are not limited to: failure to achieve desired goals, infection, bleeding, organ or nerve damage, allergic reactions, paralysis, and death. In addition, Sophia [name] was informed of those risks and  complications associated to Sophia procedure, such as failure to decrease pain; infection; bleeding; organ or nerve damage with subsequent damage to sensory, motor, and/or  autonomic systems, resulting in permanent pain, numbness, and/or weakness of one or several areas of Sophia body; allergic reactions; (i.e.: anaphylactic reaction); and/or death. Furthermore, Sophia [name] was informed of those risks and complications associated with Sophia medications. These include, but are not limited to: allergic reactions (i.e.: anaphylactic or anaphylactoid reaction(s)); adrenal axis suppression; blood sugar elevation that in diabetics may result in ketoacidosis or comma; water retention that in patients with history of congestive heart failure may result in shortness of breath, pulmonary edema, and decompensation with resultant heart failure; weight gain; swelling or edema; medication-induced neural toxicity; particulate matter embolism and blood vessel occlusion with resultant organ, and/or nervous system infarction; and/or aseptic necrosis of one or more joints. Finally, Sophia [name] was informed that Medicine is not an exact science; therefore, there is also Sophia possibility of unforeseen or unpredictable risks and/or possible complications that may result in a catastrophic outcome. Sophia [name] indicated having understood very clearly. We have given Sophia [name] no guarantees and we have made no promises. Enough time was given to Sophia [name] to ask questions, all of which were answered to Sophia [name]'s satisfaction. Sophia [name] has indicated that she wanted to continue with Sophia procedure. Attestation: I, Sophia ordering provider, attest that I have discussed with Sophia [name] Sophia benefits, risks, side-effects, alternatives, likelihood of achieving goals, and potential problems during recovery for Sophia procedure that I have provided informed consent. Date  Time: 03/03/2020 10:29 AM  Pre-Procedure Preparation:  Monitoring: As per  clinic protocol. Respiration, ETCO2, SpO2, BP, heart rate and rhythm monitor placed and checked for adequate function Safety Precautions: [name] was assessed for positional comfort and pressure points before starting Sophia procedure. Time-out: I initiated and conducted Sophia "Time-out" before starting Sophia procedure, as per protocol. Sophia [name] was asked to participate by confirming Sophia accuracy of Sophia "Time Out" information. Verification of Sophia correct person, site, and procedure were performed and confirmed by me, Sophia nursing staff, and Sophia [name]. "Time-out" conducted as per Joint Commission's Universal Protocol (UP.01.01.01). Time: 1110  Description of Procedure:          Target Area: Sophia sub-costal neurovascular bundle area Approach: Sub-costal approach Area Prepped: Entire Mid-Axillary Line  Thoracic Region DuraPrep (Iodine Povacrylex [0.7% available iodine] and Isopropyl Alcohol, 74% w/w) Safety Precautions: Aspiration looking for blood return was conducted prior to all injections. At no point did we inject any substances, as a needle was being advanced. No attempts were made at seeking any paresthesias. Safe injection practices and needle disposal techniques used. Medications properly checked for expiration dates. SDV (single dose vial) medications used. Description of Sophia Procedure: Protocol guidelines were followed. Sophia [name] was placed in position over Sophia procedure table. Sophia target area was identified and Sophia area prepped in Sophia usual manner. Skin & deeper tissues infiltrated with local anesthetic. After cleaning Sophia skin with an antiseptic solution, 1-2 mL of dilute local anesthetic was infiltrated subcutaneously at Sophia planned injection site. Sophia fingers of Sophia palpating hand were used to straddle Sophia insertion site at Sophia inferior border of Sophia rib and fix Sophia skin to avoid unwanted skin movement. Appropriate amount of time allowed to pass for local anesthetics to take effect. Sophia  procedure needles were then advanced to Sophia target area at an angle of approximately 20 cephalad to Sophia skin. Contact with Sophia rib was made. While maintaining Sophia same angle of insertion, Sophia needle was walked off Sophia inferior border of Sophia rib as Sophia skin was allowed to return  to its initial position. Then Sophia needle was advanced no more than 3 mm below Sophia inferior margin of Sophia rib. Proper needle placement was secured. Negative aspiration confirmed. Following negative aspiration for blood or air, 3-5 mL of local anesthetic was injected. Sophia solution injected in intermittent fashion, asking for systemic symptoms every 0.5cc of injectate. Sophia needle was then removed and Sophia area cleansed, making sure to leave some of Sophia prepping solution back to take advantage of its long term bactericidal properties. Vitals:   03/03/20 1100 03/03/20 1105 03/03/20 1110 03/03/20 1118  BP: (!) 160/77 (!) 170/94 (!) 165/88 (!) 157/83  Pulse: 99 (!) 103 (!) 101 (!) 101  Resp: 12 15  16   Temp:      SpO2: 100% 100%  99%  Weight:      Height:        Start Time: 1110 hrs. End Time: 1115 hrs.   Materials:  Needle(s) Type: Regular needle Gauge: 25G Length: 1.5-in Medication(s): Please see orders for medications and dosing details. 4 cc solution made of 3 cc of 0.2% ropivacaine, 1 cc of Decadron 10 mg/cc.  2 cc injected at each intercostal nerve under ultrasound guidance. Imaging Guidance (Non-Spinal):          Type of Imaging Technique: Fluoroscopy Guidance (Non-Spinal) and Ultrasound guidance Indication(s): Assistance in needle guidance and placement for procedures requiring needle placement in or near specific anatomical locations not easily accessible without such assistance. Exposure Time: Please see nurses notes. Contrast: None used. Allergy Fluoroscopic Guidance: I was personally present during Sophia use of fluoroscopy. "Tunnel Vision Technique" used to obtain Sophia best possible view of Sophia target area.  Parallax error corrected before commencing Sophia procedure. "Direction-depth-direction" technique used to introduce Sophia needle under continuous pulsed fluoroscopy. Once target was reached, antero-posterior, oblique, and lateral fluoroscopic projection used confirm needle placement in all planes. Images permanently stored in EMR. Interpretation: No contrast injected. I personally interpreted Sophia imaging intraoperatively. Adequate needle placement confirmed in multiple planes. Permanent images saved into Sophia [name]'s record.  Antibiotic Prophylaxis:   Anti-infectives (From admission, onward)   None     Indication(s): None identified  Post-operative Assessment:  Post-procedure Vital Signs:  Pulse/HCG Rate: (!) 101  Temp: 97.9 F (36.6 C) Resp: 16 BP: (!) 157/83 SpO2: 99 %  EBL: None  Complications: After Sophia procedure, [name] developed some dizziness and numbness in her face that resolved after approximately 10 to 15 minutes.  No shortness of breath.  [name] was alert and oriented to time.  No vision changes, tinnitus.  Symptoms improved upon discharge.  Note: Sophia [name] tolerated Sophia entire procedure well. A repeat set of vitals were taken after Sophia procedure and Sophia [name] was kept under observation following institutional policy, for this type of procedure. Post-procedural neurological assessment was performed, showing return to baseline, prior to discharge. Sophia [name] was provided with post-procedure discharge instructions, including a section on how to identify potential problems. Should any problems arise concerning this procedure, Sophia [name] was given instructions to immediately contact us, at any time, without hesitation. In any case, we plan to contact Sophia [name] by telephone for a follow-up status report regarding this interventional procedure.  Comments:  No additional relevant information.  Plan of Care  Orders:  Orders Placed This Encounter  Procedures  . DG PAIN  CLINIC C-ARM 1-60 MIN NO REPORT    Intraoperative interpretation by procedural physician at [place].    Standing Status:   Standing  Number of Occurrences:   1    Order Specific Question:   Reason for exam:    Answer:   Assistance in needle guidance and placement for procedures requiring needle placement in or near specific anatomical locations not easily accessible without such assistance.   Medications ordered for procedure: Meds ordered this encounter  Medications  . iohexol (OMNIPAQUE) 180 MG/ML injection 10 mL    Must be Myelogram-compatible. If not available, you may substitute with a water-soluble, non-ionic, hypoallergenic, myelogram-compatible radiological contrast medium.  Marland Kitchen lidocaine (XYLOCAINE) 2 % (with pres) injection 400 mg  . ropivacaine (PF) 2 mg/mL (0.2%) (NAROPIN) injection 9 mL  . dexamethasone (DECADRON) injection 10 mg   Medications administered: Flo D. Didio had no medications administered during this visit.  See Sophia medical record for exact dosing, route, and time of administration.  I called Sophia [name] at 1:28 PM to check on how she was doing.  She states that her facial numbness is completely resolved.  Is also endorsing reduced pain at injection site.  We will continue to monitor.  Follow-up plan:   Return in about 4 weeks (around 03/31/2020) for follow up F2F.      Status post right ultrasound-guided intercostal nerve block 03/03/2020   Recent Visits Date Type Provider Dept  02/12/20 Office Visit Edward Jolly, MD [facility]  Showing recent visits within past 90 days and meeting all other requirements   Today's Visits Date Type Provider Dept  03/03/20 Procedure visit Edward Jolly, MD [facility]  Showing today's visits and meeting all other requirements   Future Appointments Date Type Provider Dept  04/08/20 Appointment Edward Jolly, MD [facility]  Showing future appointments within next 90 days  and meeting all other requirements   Disposition: Discharge home  Discharge (Date  Time): 03/03/2020; 1120 hrs.   Primary Care Physician: Rolm Gala, MD Location: [facility] Note by: Edward Jolly, MD Date: 03/03/2020; Time: 1:27 PM  Disclaimer:  Medicine is not an exact science. Sophia only guarantee in medicine is that nothing is guaranteed. It is important to note that Sophia decision to proceed with this intervention was based on Sophia information collected from Sophia [name]. Sophia Data and conclusions were drawn from Sophia [name]'s questionnaire, Sophia interview, and Sophia physical examination. Because Sophia information was provided in large part by Sophia [name], it cannot be guaranteed that it has not been purposely or unconsciously manipulated. Every effort has been made to obtain as much relevant data as possible for this evaluation. It is important to note that Sophia conclusions that lead to this procedure are derived in large part from Sophia available data. Always take into account that Sophia treatment will also be dependent on availability of resources and existing treatment guidelines, considered by other Pain Management Practitioners as being common knowledge and practice, at Sophia time of Sophia intervention. For Medico-Legal purposes, it is also important to point out that variation in procedural techniques and pharmacological choices are Sophia acceptable norm. Sophia indications, contraindications, technique, and results of Sophia above procedure should only be interpreted and judged by a Board-Certified Interventional Pain Specialist with extensive familiarity and expertise in Sophia same exact procedure and technique.

## 2020-03-03 NOTE — Progress Notes (Signed)
Safety precautions to be maintained throughout the outpatient stay will include: orient to surroundings, keep bed in low position, maintain call bell within reach at all times, provide assistance with transfer out of bed and ambulation.  

## 2020-03-04 ENCOUNTER — Telehealth: Payer: Self-pay

## 2020-03-04 NOTE — Telephone Encounter (Signed)
No answer. Left message to check on her PP. Instructed to call if needed.

## 2020-03-11 ENCOUNTER — Encounter: Payer: Self-pay | Admitting: Physical Therapy

## 2020-03-11 DIAGNOSIS — M546 Pain in thoracic spine: Secondary | ICD-10-CM

## 2020-03-11 DIAGNOSIS — M62838 Other muscle spasm: Secondary | ICD-10-CM

## 2020-03-11 DIAGNOSIS — M6281 Muscle weakness (generalized): Secondary | ICD-10-CM

## 2020-03-11 NOTE — Therapy (Signed)
[facility] [address] [place], [address] Phone: [phone]   Fax:  [phone]  Physical Therapy No-Visit Discharge Summary Reporting period: 09/04/2020 - 03/11/2020  Patient Details  Name: Sophia [name] MRN: [id number] Date of Birth: 11-11-1960 Referring Provider (PT): Meeler, Loree Fee, NP   Encounter Date: 03/11/2020    Past Medical History:  Diagnosis Date  . Allergic rhinitis   . Asthma, mild intermittent   . Mitral valve prolapse   . Thyroid disease     Past Surgical History:  Procedure Laterality Date  . SHOULDER SURGERY  1990s   Left  . SPLENECTOMY  1978  . TOE SURGERY      There were no vitals filed for this visit.  Subjective Assessment - 03/11/20 1504    Subjective  Patient emailed 02/12/20 iwth an update "I saw Dr Holley Raring today at the pain clinic.  He has diagnosed intercostal neuralgia.  He said it could be due to a slipped rib. I am going to have an injection in a few weeks and will see if that gets rid of the pain.  He did not know why I have the burping with the pain, but maybe that will go away after the injection." Her PT case is now being discharged at this time due to reaching maximum improvement and patient no longer attending.    Pertinent History  Patient is a 59 y.o. female who presents to outpatient physical therapy with a referral for medical diagnosis chronic right flank pain. This patient's chief complaints consist of gradually worsening over the last 6 years, idiopathic onset,  intermittent stabbing/contant aching pain at R lateral trunk near ribs 8-10 that occasionally radiates anteriorly or towards spine and irritated with movement of this region in ipsilateral sidebending, rotation, or spine extension leading to the following functional deficits: any activity that requires moving the trunk in rotation, sidebending, extension including unable to do yoga (legs up the wall),  laying on the back, raising head and shoulders up from lying on back, wakes her up at night, playing golf (able to play if takes deep breath and holds on back swing and until follow through is over). Continues to have gas when she does twisting and touching there, not exercising like she wants to, cannot do all lower back exercises for her low back pain, bicycles. She can play 1 day of golf and takes 2 days to recover. Used to play 4 x per week without difficulty. Relevant past medical history and comorbidities include cholecystectomy 05/23/2019, asthma, chronic low back pain with most recent flair up the most severe she has had including L leg numbness to foot, B12 deficiency, splenectomy, L great toe surgery, Left shoulder surgery, hypothyroidism, possible mitral valve prolapse, heredity spherocytosis, anemia.    Limitations  Sitting    How long can you sit comfortably?  sitting okay    How long can you stand comfortably?  limited by low back: 1 hour    How long can you walk comfortably?  able to walk  at least 30 min (knees limiting)    Diagnostic tests  Thoracic MRI report 10/25/2019: "IMPRESSION: 1. Prominent degenerative endplate spurring along the right anterolateral aspect of the thoracic spine extending from T4-5 through T9-10. Findings could contribute to right-sided pain. 2. Right greater than left facet hypertrophy at T6-7 and T7-8without stenosis. 3. Small left paracentral disc protrusion at T3-4 with minimal flattening of the left hemi cord. No significant stenosis."  Patient Stated Goals  "make the pain go away" and be able to play golf, exercise, do yoga, all that stuff that she was doing before that she could do before it was constant pain. Golf was the main thing she had to give up.       OBJECTIVE Patient is not present for examination at this time. Please see previous documentation for latest objective data.     PT Short Term Goals - 09/19/19 1140      PT SHORT TERM GOAL #1    Title  Be independent with initial home exercise program for self-management of symptoms.    Baseline  Initial HEP to be assigned visit 2(09/05/2019);    Time  2    Period  Weeks    Status  Achieved    Target Date  09/19/19        PT Long Term Goals - 11/21/19 1928      PT LONG TERM GOAL #1   Title  Be independent with a long-term home exercise program for self-management of symptoms.    Baseline  Initial HEP to be established visit 2 (09/05/2019); patient currently participating in appropriate HEP (11/07/2019; 11/21/2019);    Time  12    Period  Weeks    Status  Partially Met    Target Date  01/30/20      PT LONG TERM GOAL #2   Title  Reduce pain with functional activities to equal or less than 1/10 to allow patient to complete usual activities including ADLs, IADLs, and social engagement with less difficulty.    Baseline  10/10 (09/05/2019); continues to have high levels of pain at times (11/07/2019); max pain has been 1/10 over the last week (11/21/2019);    Time  12    Period  Weeks    Status  Partially Met    Target Date  12/02/19      PT LONG TERM GOAL #3   Title  Have full thoracic AROM with no compensations or increase in pain in all planes except intermittent end range discomfort to allow patient to complete valued activities with less difficulty.    Baseline  pain with R sidebending, R rotation, and extension (09/05/2019); improved but limited - see objective exam (11/07/2019);    Time  12    Period  Weeks    Status  Partially Met    Target Date  01/30/20      PT LONG TERM GOAL #4   Title  Complete community, work and/or recreational activities without limitation due to current condition.    Baseline  limiting ability to complete her usual ADLs, IADLs, and social participation including anything that requires motion of the R trunk, yoga, laying on the back, sleeping, crunches, competitive golf, exercises prescribed for her low back pain, being active, exercise program  (09/05/2019); continues to have similar limitations but has less belching and has improved participation in golfing slightly (11/07/2019); notes improved tolerance but has not yet returned to all activities (11/21/2019);    Time  12    Period  Weeks    Status  Partially Met    Target Date  01/30/20      PT LONG TERM GOAL #5   Title  Demonstrate improved FOTO score by 10 units to demonstrate improvement in overall condition and self-reported functional ability.    Baseline  to be measured visit 2 (09/05/2019); 44 (09/10/2019); 46 (11/07/2019); 49 (11/21/2019);    Time  12  Period  Weeks    Status  Partially Met    Target Date  01/30/20        Plan - 03/11/20 1510    Clinical Impression Statement  Patient attended 10 physical therapy sessions this episode of care. Initially her progress was slow and she continued to have symptoms, so she returned to her referring physician who ordered a thoracic MRI which was negative. She had started to have some success with interventions for slipping rib syndrome prior to going for MRI, so she returned to PT and has responded well to taping. Also had reported decreased belching and improved confidence in self-management of symptoms including using taping and home stretching routine to improve pain control. She had not yet returned to golfing because of weather issues and had not yet shown long term or complete stability in symptoms. Plan at last session (11/22/2019) was for patient to continue self-managing during the next few weeks and come back in for further sessions if she gets stuck in her progress or regression or feels she requires further PT guidance.She did pursue continued medical evaluation and an injection was reccommended. She is now discharged from physical therapy due to reaching maximum improvement from PT at this time    Personal Factors and Comorbidities  Age;Comorbidity 3+;Time since onset of injury/illness/exacerbation;Past/Current Experience    long history of lack of identifiable cause and unsuccessful treatment   Comorbidities  Relevant past medical history and comorbidities include cholecystectomy 05/23/2019, asthma, chronic low back pain with most recent flair up the most severe she has had including L leg numbness to foot, B12 deficiency, splenectomy, L great toe surgery, Left shoulder surgery, hypothyroidism, possible mitral valve prolapse, heredity spherocytosis, anemia.    Examination-Activity Limitations  Sit;Transfers;Bed Mobility;Hygiene/Grooming;Sleep;Bend;Lift;Squat;Adult nurse;Reach Overhead;Other   anything that requires motion of the R trunk, yoga, laying on the back, sleeping, crunches, competitive golf, exercises prescribed for her low back pain, being active, exercise program   Examination-Participation Restrictions  Interpersonal Relationship;Yard Work;Cleaning;Community Activity   golfing, yoga   Stability/Clinical Decision Making  Evolving/Moderate complexity    Rehab Potential  Fair    PT Frequency  2x / week    PT Duration  6 weeks    PT Treatment/Interventions  ADLs/Self Care Home Management;Aquatic Therapy;Cryotherapy;Moist Heat;Therapeutic activities;Therapeutic exercise;Neuromuscular re-education;Patient/family education;Functional mobility training;Manual techniques;Passive range of motion;Dry needling;Spinal Manipulations;Joint Manipulations    PT Next Visit Plan  Patient is now discharged from physical therapy at this time    PT [place] Access Code: [facility]    Consulted and Agree with Plan of Care  Patient       Patient will benefit from skilled therapeutic intervention in order to improve the following deficits and impairments:  Difficulty walking, Increased muscle spasms, Decreased range of motion, Impaired perceived functional ability, Decreased activity tolerance, Decreased strength, Impaired flexibility, Impaired UE functional use, Postural dysfunction, Pain, Decreased  endurance, Decreased mobility  Visit Diagnosis: Pain in thoracic spine  Other muscle spasm  Muscle weakness (generalized)     Problem List Patient Active Problem List   Diagnosis Date Noted  . Intercostal neuralgia 02/12/2020  . Chronic chest wall pain 02/12/2020  . Chronic pain syndrome 02/12/2020  . ACUTE SINUSITIS, UNSPECIFIED 10/16/2007  . RASH AND OTHER NONSPECIFIC SKIN ERUPTION 09/26/2007  . GOITER NOS 06/06/2007  . SPHEROCYTOSIS, HEREDITARY 06/06/2007  . MIGRAINE HEADACHE 06/06/2007  . ALLERGIC RHINITIS 06/06/2007  . ASTHMA 06/06/2007  . INTERSTITIAL CYSTITIS 06/06/2007  . LOW BACK PAIN, CHRONIC 06/06/2007  .  Personal history of unspecified circulatory disease 06/06/2007    Everlean Alstrom. Graylon Good, PT, DPT 03/11/20, 3:10 PM  [facility] [address], [place], [address] Phone: [phone]   Fax:  [phone]  Name: Sophia [name] MRN: [id number] Date of Birth: [birth year]

## 2020-04-08 ENCOUNTER — Ambulatory Visit
Payer: 59 | Attending: Student in an Organized Health Care Education/Training Program | Admitting: Student in an Organized Health Care Education/Training Program

## 2020-04-08 ENCOUNTER — Other Ambulatory Visit: Payer: Self-pay

## 2020-04-08 ENCOUNTER — Ambulatory Visit: Payer: 59 | Admitting: Student in an Organized Health Care Education/Training Program

## 2020-04-08 ENCOUNTER — Encounter: Payer: Self-pay | Admitting: Student in an Organized Health Care Education/Training Program

## 2020-04-08 VITALS — BP 144/78 | HR 98 | Temp 98.4°F | Resp 18 | Ht 66.0 in | Wt 193.0 lb

## 2020-04-08 DIAGNOSIS — R0789 Other chest pain: Secondary | ICD-10-CM | POA: Diagnosis not present

## 2020-04-08 DIAGNOSIS — G588 Other specified mononeuropathies: Secondary | ICD-10-CM | POA: Insufficient documentation

## 2020-04-08 DIAGNOSIS — G894 Chronic pain syndrome: Secondary | ICD-10-CM | POA: Diagnosis present

## 2020-04-08 DIAGNOSIS — G8929 Other chronic pain: Secondary | ICD-10-CM | POA: Diagnosis not present

## 2020-04-08 NOTE — Progress Notes (Signed)
Safety precautions to be maintained throughout the outpatient stay will include: orient to surroundings, keep bed in low position, maintain call bell within reach at all times, provide assistance with transfer out of bed and ambulation.  

## 2020-04-08 NOTE — Progress Notes (Signed)
PROVIDER NOTE: Information contained herein reflects review and annotations entered in association with encounter. Interpretation of such information and data should be left to medically-trained personnel. Information provided to patient can be located elsewhere in the medical record under "Patient Instructions". Document created using STT-dictation technology, any transcriptional errors that may result from process are unintentional.    Patient: Sophia [name]  Service Category: E/M  Provider: Gillis Santa, MD  DOB: December 17, 1960  DOS: 04/08/2020  Specialty: Interventional Pain Management  MRN: [id number]  Setting: Ambulatory outpatient  PCP: Hortencia Pilar, MD  Type: Established Patient    Referring Provider: Hortencia Pilar, MD  Location: Office  Delivery: Face-to-face     HPI  Reason for encounter: Ms. VIKA BUSKE, a 59 y.o. year old female, is here today for evaluation and management of her Intercostal neuralgia [G58.8]. Ms. Klapper primary complain today is Pain (right ribcage) Last encounter: Practice (03/04/2020). My last encounter with her was on 03/03/2020. Pertinent problems: Ms. Blomberg has Intercostal neuralgia; Chronic chest wall pain; and Chronic pain syndrome on their pertinent problem list. Pain Assessment: Severity of   is reported as a 1 /10. Location: Rib cage Right/ . Onset: More than a month ago. Quality: Sore. Timing: Intermittent. Modifying factor(s): procedures, tylenol, lidocaine gel. Vitals:  height is 5' 6" (1.676 m) and weight is 193 lb (87.5 kg). Her temporal temperature is 98.4 F (36.9 C). Her blood pressure is 144/78 (abnormal) and her pulse is 98. Her respiration is 18 and oxygen saturation is 98%.    Post-Procedure Evaluation  Procedure:    Type: Diagnostic Mid-Axillary Line Intercostal  Nerve Block           Region: Mid-Axillary Line  Thoracic Area Level: Right T7,8 Ribs Laterality: Right-Sided  Type: Local Anesthesia  Local Anesthetic: Lidocaine  1-2%  Position: Lateral decubitus, tilted anteriorly (approx. 45 debrees), w/ the painful side up   Indications: 1. Intercostal neuralgia      Sedation: Please see nurses note.  Effectiveness during initial hour after procedure(Ultra-Short Term Relief): 100 %   Local anesthetic used: Long-acting (4-6 hours) Effectiveness: Defined as any analgesic benefit obtained secondary to the administration of local anesthetics. This carries significant diagnostic value as to the etiological location, or anatomical origin, of the pain. Duration of benefit is expected to coincide with the duration of the local anesthetic used.  Effectiveness during initial 4-6 hours after procedure(Short-Term Relief): 100 %   Long-term benefit: Defined as any relief past the pharmacologic duration of the local anesthetics.  Effectiveness past the initial 6 hours after procedure(Long-Term Relief): 60 % (some stabbing sensation returned 06/05 and burping returned.)   Current benefits: Defined as benefit that persist at this time.  50%  Analgesia:  Somewhat improved Function: Back to baseline ROM: Back to baseline   ROS  Constitutional: Denies any fever or chills Gastrointestinal: No reported hemesis, hematochezia, vomiting, or acute GI distress Musculoskeletal: Denies any acute onset joint swelling, redness, loss of ROM, or weakness Neurological: No reported episodes of acute onset apraxia, aphasia, dysarthria, agnosia, amnesia, paralysis, loss of coordination, or loss of consciousness  Medication Review  Omega-3 Fatty Acids, acetaminophen, fluticasone, levothyroxine, multivitamin with minerals, omeprazole, and trolamine salicylate  History Review  Allergy: Ms. Christine is allergic to contrast media [iodinated diagnostic agents], codeine, metoprolol, phenergan [promethazine hcl], sulfa antibiotics, sulfamethoxazole-trimethoprim, tequin [gatifloxacin], and latex. Drug: Ms. Vath  reports no history of drug  use. Alcohol:  reports no history of alcohol use. Tobacco:  reports that she  has never smoked. She has never used smokeless tobacco. Social: Ms. Okeefe  reports that she has never smoked. She has never used smokeless tobacco. She reports that she does not drink alcohol and does not use drugs. Medical:  has a past medical history of Allergic rhinitis, Asthma, mild intermittent, Mitral valve prolapse, and Thyroid disease. Surgical: Ms. Salasar  has a past surgical history that includes Splenectomy (1978); Shoulder surgery (1990s); and Toe Surgery. Family: family history includes Breast cancer in an other family member; Colon cancer in her paternal grandmother; Coronary artery disease in her mother; Diabetes in her mother; Heart failure in her mother; Hypertension in her father; Obesity in her mother; Parkinsonism in her maternal grandfather; Stroke in her maternal grandmother, paternal grandfather, and paternal grandmother.  Laboratory Chemistry Profile   Renal Lab Results  Component Value Date   BUN 10 12/16/2016   CREATININE 0.80 12/16/2016   GFRAA >60 12/16/2016   GFRNONAA >60 12/16/2016     Hepatic Lab Results  Component Value Date   AST 34 12/16/2016   ALT 34 12/16/2016   ALBUMIN 4.0 12/16/2016   ALKPHOS 108 12/16/2016     Electrolytes Lab Results  Component Value Date   NA 141 12/16/2016   K 3.6 12/16/2016   CL 106 12/16/2016   CALCIUM 8.8 (L) 12/16/2016     Bone No results found for: VD25OH, VD125OH2TOT, YJ8563JS9, FW2637CH8, 25OHVITD1, 25OHVITD2, 25OHVITD3, TESTOFREE, TESTOSTERONE   Inflammation (CRP: Acute Phase) (ESR: Chronic Phase) No results found for: CRP, ESRSEDRATE, LATICACIDVEN     Note: Above Lab results reviewed.  Recent Imaging Review  DG PAIN CLINIC C-ARM 1-60 MIN NO REPORT Fluoro was used, but no Radiologist interpretation will be provided.  Please refer to "NOTES" tab for provider progress note. Note: Reviewed        Physical Exam  General  appearance: Well nourished, well developed, and well hydrated. In no apparent acute distress Mental status: Alert, oriented x 3 (person, place, & time)       Respiratory: No evidence of acute respiratory distress Eyes: PERLA Vitals: BP (!) 144/78   Pulse 98   Temp 98.4 F (36.9 C) (Temporal)   Resp 18   Ht 5' 6" (1.676 m)   Wt 193 lb (87.5 kg)   SpO2 98%   BMI 31.15 kg/m  BMI: Estimated body mass index is 31.15 kg/m as calculated from the following:   Height as of this encounter: 5' 6" (1.676 m).   Weight as of this encounter: 193 lb (87.5 kg). Ideal: Ideal body weight: 59.3 kg (130 lb 11.7 oz) Adjusted ideal body weight: 70.6 kg (155 lb 10.2 oz)   Intercostal, subcostal region, tender to palpation along lateral rib, likely T8-T9 distribution no erythema, discoloration, swelling, or allodynia noted.  No significant pain upon superficial palpation.  Pain location has decreased in size and is more anterior than before.  Assessment   Status Diagnosis  Responding Responding Responding 1. Intercostal neuralgia   2. Chronic chest wall pain   3. Chronic pain syndrome      Updated Problems: Problem  Intercostal Neuralgia  Chronic Chest Wall Pain  Chronic Pain Syndrome    Plan of Care   Ms. DAMIKA HARMON has a current medication list which includes the following long-term medication(s): fluticasone, levothyroxine, and omeprazole.   Postprocedural follow-up status post right intercostal nerve block at T6-T7 for intercostal neuralgia and right chest wall pain.  Patient endorses analgesic benefit and functional improvement after her right intercostal  nerve block.  She states that she noticed a reduction in her pain and that her burping episodes also decreased.  She states that she was able to play golf with less pain.  She also finds performing ADLs a little bit easier now.  Her pain location has shifted slightly anteriorly and has a smaller distribution pattern now.  We  discussed repeating right intercostal nerve block #2 in utilizing more volume.  Patient has a contrast allergy so we will do this under fluoroscopy and ultrasound guidance.  Risks and benefits discussed and patient like to proceed with right intercostal nerve block #2 without sedation under ultrasound guidance.  Orders:  Orders Placed This Encounter  Procedures  . INTERCOSTAL NERVE BLOCK    Standing Status:   Future    Standing Expiration Date:   10/08/2020    Scheduling Instructions:     Side: RIGHT     Sedation: Without Sedation.     Timeframe: ASAA    Order Specific Question:   Where will this procedure be performed?    Answer:   [facility]   Follow-up plan:   Return in about 3 weeks (around 04/29/2020) for Right ICNB with US guidance, without sedation.     Status post right ultrasound-guided intercostal nerve block 03/03/2020 helped, repeat    Recent Visits Date Type Provider Dept  03/03/20 Procedure visit Gillis Santa, MD [facility]  02/12/20 Office Visit Gillis Santa, MD [facility]  Showing recent visits within past 90 days and meeting all other requirements Today's Visits Date Type Provider Dept  04/08/20 Office Visit Gillis Santa, MD [facility]  Showing today's visits and meeting all other requirements Future Appointments No visits were found meeting these conditions. Showing future appointments within next 90 days and meeting all other requirements  I discussed the assessment and treatment plan with the patient. The patient was provided an opportunity to ask questions and all were answered. The patient agreed with the plan and demonstrated an understanding of the instructions.  Patient advised to call back or seek an in-person evaluation if the symptoms or condition worsens.  Duration of encounter: 25 minutes.  Note by: Gillis Santa, MD Date: 04/08/2020; Time: 1:27 PM

## 2020-04-28 ENCOUNTER — Other Ambulatory Visit: Payer: Self-pay

## 2020-04-28 ENCOUNTER — Ambulatory Visit (HOSPITAL_BASED_OUTPATIENT_CLINIC_OR_DEPARTMENT_OTHER): Payer: 59 | Admitting: Student in an Organized Health Care Education/Training Program

## 2020-04-28 ENCOUNTER — Ambulatory Visit
Admission: RE | Admit: 2020-04-28 | Discharge: 2020-04-28 | Disposition: A | Discharge status: Home / Self Care | Payer: 59 | Source: Ambulatory Visit | Attending: Student in an Organized Health Care Education/Training Program | Admitting: Student in an Organized Health Care Education/Training Program

## 2020-04-28 ENCOUNTER — Encounter: Payer: Self-pay | Admitting: Student in an Organized Health Care Education/Training Program

## 2020-04-28 DIAGNOSIS — Z881 Allergy status to other antibiotic agents status: Secondary | ICD-10-CM | POA: Diagnosis not present

## 2020-04-28 DIAGNOSIS — Z888 Allergy status to other drugs, medicaments and biological substances status: Secondary | ICD-10-CM | POA: Insufficient documentation

## 2020-04-28 DIAGNOSIS — G588 Other specified mononeuropathies: Secondary | ICD-10-CM

## 2020-04-28 DIAGNOSIS — Z886 Allergy status to analgesic agent status: Secondary | ICD-10-CM | POA: Diagnosis not present

## 2020-04-28 DIAGNOSIS — G580 Intercostal neuropathy: Secondary | ICD-10-CM | POA: Diagnosis not present

## 2020-04-28 DIAGNOSIS — G894 Chronic pain syndrome: Secondary | ICD-10-CM

## 2020-04-28 DIAGNOSIS — Z9081 Acquired absence of spleen: Secondary | ICD-10-CM | POA: Insufficient documentation

## 2020-04-28 DIAGNOSIS — Z91040 Latex allergy status: Secondary | ICD-10-CM | POA: Diagnosis not present

## 2020-04-28 DIAGNOSIS — Z91041 Radiographic dye allergy status: Secondary | ICD-10-CM | POA: Diagnosis not present

## 2020-04-28 DIAGNOSIS — R0782 Intercostal pain: Secondary | ICD-10-CM | POA: Diagnosis present

## 2020-04-28 MED ORDER — ROPIVACAINE HCL 2 MG/ML IJ SOLN
9.0000 mL | Freq: Once | INTRAMUSCULAR | Status: AC
  Administered 2020-04-28: 9 mL via PERINEURAL

## 2020-04-28 MED ORDER — METHYLPREDNISOLONE ACETATE 40 MG/ML IJ SUSP
INTRAMUSCULAR | Status: AC
  Filled 2020-04-28: qty 1

## 2020-04-28 MED ORDER — DEXAMETHASONE SODIUM PHOSPHATE 10 MG/ML IJ SOLN
INTRAMUSCULAR | Status: AC
  Filled 2020-04-28: qty 1

## 2020-04-28 MED ORDER — IOHEXOL 180 MG/ML  SOLN
10.0000 mL | Freq: Once | INTRAMUSCULAR | Status: AC
  Administered 2020-04-28: 10 mL via EPIDURAL

## 2020-04-28 MED ORDER — IOHEXOL 180 MG/ML  SOLN
INTRAMUSCULAR | Status: AC
  Filled 2020-04-28: qty 20

## 2020-04-28 MED ORDER — LIDOCAINE HCL 2 % IJ SOLN
20.0000 mL | Freq: Once | INTRAMUSCULAR | Status: AC
  Administered 2020-04-28: 200 mg

## 2020-04-28 MED ORDER — ROPIVACAINE HCL 2 MG/ML IJ SOLN
INTRAMUSCULAR | Status: AC
  Filled 2020-04-28: qty 10

## 2020-04-28 MED ORDER — DEXAMETHASONE SODIUM PHOSPHATE 10 MG/ML IJ SOLN
10.0000 mg | Freq: Once | INTRAMUSCULAR | Status: AC
  Administered 2020-04-28: 10 mg
  Filled 2020-04-28: qty 1

## 2020-04-28 MED ORDER — FENTANYL CITRATE (PF) 100 MCG/2ML IJ SOLN
INTRAMUSCULAR | Status: AC
  Filled 2020-04-28: qty 2

## 2020-04-28 MED ORDER — LIDOCAINE HCL 2 % IJ SOLN
INTRAMUSCULAR | Status: AC
  Filled 2020-04-28: qty 10

## 2020-04-28 MED ORDER — DEXAMETHASONE SODIUM PHOSPHATE 10 MG/ML IJ SOLN
10.0000 mg | Freq: Once | INTRAMUSCULAR | Status: AC
  Administered 2020-04-28: 10 mg

## 2020-04-28 NOTE — Progress Notes (Signed)
Safety precautions to be maintained throughout the outpatient stay will include: orient to surroundings, keep bed in low position, maintain call bell within reach at all times, provide assistance with transfer out of bed and ambulation.  

## 2020-04-28 NOTE — Patient Instructions (Signed)

## 2020-04-28 NOTE — Progress Notes (Signed)
PROVIDER NOTE: Information contained herein reflects review and annotations entered in association with encounter. Interpretation of such information and data should be left to medically-trained personnel. Information provided to patient can be located elsewhere in the medical record under "Patient Instructions". Document created using STT-dictation technology, any transcriptional errors that may result from process are unintentional.    Patient: Sophia [name]  Service Category: Procedure  Provider: Edward Jolly, MD  DOB: 19-Feb-1961  DOS: 04/28/2020  Location: [facility]  MRN: [id number]  Setting: Ambulatory - outpatient  Referring Provider: Edward Jolly, MD  Type: Established Patient  Specialty: Interventional Pain Management  PCP: Rolm Gala, MD   Primary Reason for Visit: Interventional Pain Management Treatment. CC: RIGHT intercostal pain  Procedure:          Anesthesia, Analgesia, Anxiolysis:  Type: Therapeutic Mid-Axillary Line Intercostal  Nerve Block  #2 (#1 done 03/03/20) Region: Mid-Axillary Line  Thoracic Area Level: Right T7,8 Ribs Laterality: Right-Sided  Type: Local Anesthesia  Local Anesthetic: Lidocaine 1-2%  Position: Lateral decubitus, tilted anteriorly (approx. 45 debrees), w/ the painful side up   Indications: 1. Intercostal neuralgia   2. Chronic pain syndrome    Pain Score: Pre-procedure: 2 /10 Post-procedure: 2 /10   Pre-op Assessment:  Sophia [name] is a 59 y.o. (year old), female patient, seen today for interventional treatment. She  has a past surgical history that includes Splenectomy (1978); Shoulder surgery (1990s); and Toe Surgery. Sophia [name] has a current medication list which includes the following prescription(s): acetaminophen, cyanocobalamin, fluticasone, levothyroxine, levothyroxine, multivitamin with minerals, omega-3 fatty acids, omeprazole, and trolamine salicylate. Her primarily concern today is the No chief complaint on  file.  Initial Vital Signs:  Pulse/HCG Rate: 88  Temp: (!) 97.3 F (36.3 C) Resp: 18 BP: 139/90 SpO2: 100 %  BMI: Estimated body mass index is 31.47 kg/m as calculated from the following:   Height as of this encounter: 5\' 6"  (1.676 m).   Weight as of this encounter: 195 lb (88.5 kg).  Risk Assessment: Allergies: Reviewed. She is allergic to contrast media [iodinated diagnostic agents], codeine, metoprolol, phenergan [promethazine hcl], sulfa antibiotics, sulfamethoxazole-trimethoprim, tequin [gatifloxacin], and latex.  Allergy Precautions: None required Coagulopathies: Reviewed. None identified.  Blood-thinner therapy: None at this time Active Infection(s): Reviewed. None identified. Sophia [name] is afebrile  Site Confirmation: Sophia [name] was asked to confirm the procedure and laterality before marking the site Procedure checklist: Completed Consent: Before the procedure and under the influence of no sedative(s), amnesic(s), or anxiolytics, the patient was informed of the treatment options, risks and possible complications. To fulfill our ethical and legal obligations, as recommended by the American Medical Association's Code of Ethics, I have informed the patient of my clinical impression; the nature and purpose of the treatment or procedure; the risks, benefits, and possible complications of the intervention; the alternatives, including doing nothing; the risk(s) and benefit(s) of the alternative treatment(s) or procedure(s); and the risk(s) and benefit(s) of doing nothing. The patient was provided information about the general risks and possible complications associated with the procedure. These may include, but are not limited to: failure to achieve desired goals, infection, bleeding, organ or nerve damage, allergic reactions, paralysis, and death. In addition, the patient was informed of those risks and complications associated to the procedure, such as failure to decrease pain;  infection; bleeding; organ or nerve damage with subsequent damage to sensory, motor, and/or autonomic systems, resulting in permanent pain, numbness, and/or weakness of one or several areas of  the body; allergic reactions; (i.e.: anaphylactic reaction); and/or death. Furthermore, the patient was informed of those risks and complications associated with the medications. These include, but are not limited to: allergic reactions (i.e.: anaphylactic or anaphylactoid reaction(s)); adrenal axis suppression; blood sugar elevation that in diabetics may result in ketoacidosis or comma; water retention that in patients with history of congestive heart failure may result in shortness of breath, pulmonary edema, and decompensation with resultant heart failure; weight gain; swelling or edema; medication-induced neural toxicity; particulate matter embolism and blood vessel occlusion with resultant organ, and/or nervous system infarction; and/or aseptic necrosis of one or more joints. Finally, the patient was informed that Medicine is not an exact science; therefore, there is also the possibility of unforeseen or unpredictable risks and/or possible complications that may result in a catastrophic outcome. The patient indicated having understood very clearly. We have given the patient no guarantees and we have made no promises. Enough time was given to the patient to ask questions, all of which were answered to the patient's satisfaction. Sophia [name] has indicated that she wanted to continue with the procedure. Attestation: I, the ordering provider, attest that I have discussed with the patient the benefits, risks, side-effects, alternatives, likelihood of achieving goals, and potential problems during recovery for the procedure that I have provided informed consent. Date  Time: 04/28/2020  8:49 AM  Pre-Procedure Preparation:  Monitoring: As per clinic protocol. Respiration, ETCO2, SpO2, BP, heart rate and rhythm monitor  placed and checked for adequate function Safety Precautions: Patient was assessed for positional comfort and pressure points before starting the procedure. Time-out: I initiated and conducted the "Time-out" before starting the procedure, as per protocol. The patient was asked to participate by confirming the accuracy of the "Time Out" information. Verification of the correct person, site, and procedure were performed and confirmed by me, the nursing staff, and the patient. "Time-out" conducted as per Joint Commission's Universal Protocol (UP.01.01.01). Time: 0929  Description of Procedure:          Target Area: The sub-costal neurovascular bundle area Approach: Sub-costal approach Area Prepped: Entire Mid-Axillary Line  Thoracic Region DuraPrep (Iodine Povacrylex [0.7% available iodine] and Isopropyl Alcohol, 74% w/w) Safety Precautions: Aspiration looking for blood return was conducted prior to all injections. At no point did we inject any substances, as a needle was being advanced. No attempts were made at seeking any paresthesias. Safe injection practices and needle disposal techniques used. Medications properly checked for expiration dates. SDV (single dose vial) medications used. Description of the Procedure: Protocol guidelines were followed. The patient was placed in position over the procedure table. The target area was identified and the area prepped in the usual manner. Skin & deeper tissues infiltrated with local anesthetic. After cleaning the skin with an antiseptic solution, 1-2 mL of dilute local anesthetic was infiltrated subcutaneously at the planned injection site. The fingers of the palpating hand were used to straddle the insertion site at the inferior border of the rib and fix the skin to avoid unwanted skin movement. Appropriate amount of time allowed to pass for local anesthetics to take effect. The procedure needles were then advanced to the target area at an angle of approximately  20 cephalad to the skin. Contact with the rib was made. While maintaining the same angle of insertion, the needle was walked off the inferior border of the rib as the skin was allowed to return to its initial position. Then the needle was advanced no more than 3 mm  below the inferior margin of the rib. Proper needle placement was secured. Negative aspiration confirmed. Following negative aspiration for blood or air, 3-5 mL of local anesthetic was injected. The solution injected in intermittent fashion, asking for systemic symptoms every 0.5cc of injectate. The needle was then removed and the area cleansed, making sure to leave some of the prepping solution back to take advantage of its long term bactericidal properties. Vitals:   04/28/20 0854 04/28/20 0925 04/28/20 0935 04/28/20 0944  BP: 139/90 (!) 160/85 (!) 161/90 (!) 158/88  Pulse: 88 96 99 98  Resp: 18 16 12 13   Temp:      SpO2: 100% 100% 99% 98%  Weight:      Height:        Start Time: 0929 hrs. End Time: 0941 hrs.   Materials:  Needle(s) Type: Spinal needle Gauge: 25G Length: 3.5-in Medication(s): Please see orders for medications and dosing details. 6 cc solution made of 5 cc of 0.2% ropivacaine, 1 cc of Decadron 10 mg/cc.  3 cc injected at each intercostal nerve under ultrasound guidance, needle tip visualized at all times, pleural border was avoided. Imaging Guidance (Non-Spinal):          Type of Imaging Technique: Fluoroscopy Guidance (Non-Spinal) and Ultrasound guidance Indication(s): Assistance in needle guidance and placement for procedures requiring needle placement in or near specific anatomical locations not easily accessible without such assistance. Exposure Time: Please see nurses notes. Contrast: None used. Allergy Fluoroscopic Guidance: I was personally present during the use of fluoroscopy. "Tunnel Vision Technique" used to obtain the best possible view of the target area. Parallax error corrected before commencing  the procedure. "Direction-depth-direction" technique used to introduce the needle under continuous pulsed fluoroscopy. Once target was reached, antero-posterior, oblique, and lateral fluoroscopic projection used confirm needle placement in all planes. Images permanently stored in EMR. Interpretation: No contrast injected. I personally interpreted the imaging intraoperatively. Adequate needle placement confirmed in multiple planes. Permanent images saved into the patient's record.  Done under guidance Antibiotic Prophylaxis:   Anti-infectives (From admission, onward)   None     Indication(s): None identified  Post-operative Assessment:  Post-procedure Vital Signs:  Pulse/HCG Rate: 98  Temp: (!) 97.3 F (36.3 C) Resp: 13 BP: (!) 158/88 SpO2: 98 %  EBL: None  Complications: After the procedure, patient developed some dizziness and numbness in her face that resolved after approximately 10 to 15 minutes.  No shortness of breath.  Patient was alert and oriented to time.  No vision changes, tinnitus.  Symptoms improved upon discharge.  Note: The patient tolerated the entire procedure well. A repeat set of vitals were taken after the procedure and the patient was kept under observation following institutional policy, for this type of procedure. Post-procedural neurological assessment was performed, showing return to baseline, prior to discharge. The patient was provided with post-procedure discharge instructions, including a section on how to identify potential problems. Should any problems arise concerning this procedure, the patient was given instructions to immediately contact us, at any time, without hesitation. In any case, we plan to contact the patient by telephone for a follow-up status report regarding this interventional procedure.  Comments:  No additional relevant information.  Plan of Care  Orders:  Orders Placed This Encounter  Procedures  . DG PAIN CLINIC C-ARM 1-60 MIN NO  REPORT    Intraoperative interpretation by procedural physician at [facility].    Standing Status:   Standing    Number of Occurrences:  1    Order Specific Question:   Reason for exam:    Answer:   Assistance in needle guidance and placement for procedures requiring needle placement in or near specific anatomical locations not easily accessible without such assistance.   Medications ordered for procedure: Meds ordered this encounter  Medications  . iohexol (OMNIPAQUE) 180 MG/ML injection 10 mL    Must be Myelogram-compatible. If not available, you may substitute with a water-soluble, non-ionic, hypoallergenic, myelogram-compatible radiological contrast medium.  Marland Kitchen lidocaine (XYLOCAINE) 2 % (with pres) injection 400 mg  . dexamethasone (DECADRON) injection 10 mg  . dexamethasone (DECADRON) injection 10 mg  . ropivacaine (PF) 2 mg/mL (0.2%) (NAROPIN) injection 9 mL   Medications administered: We administered iohexol, lidocaine, dexamethasone, dexamethasone, and ropivacaine (PF) 2 mg/mL (0.2%).  See the medical record for exact dosing, route, and time of administration.   Follow-up plan:   Return in about 5 weeks (around 06/02/2020) for Post Procedure Evaluation, in person.      Status post right ultrasound-guided intercostal nerve block 03/03/2020, #2 04/28/20   Recent Visits Date Type Provider Dept  04/08/20 Office Visit Edward Jolly, MD [facility]  03/03/20 Procedure visit Edward Jolly, MD [facility]  02/12/20 Office Visit Edward Jolly, MD [facility]  Showing recent visits within past 90 days and meeting all other requirements Today's Visits Date Type Provider Dept  04/28/20 Procedure visit Edward Jolly, MD [facility]  Showing today's visits and meeting all other requirements Future Appointments Date Type Provider Dept  06/03/20 Appointment Edward Jolly, MD [facility]  Showing future appointments within next  90 days and meeting all other requirements  Disposition: Discharge home  Discharge (Date  Time): 04/28/2020; 0947 hrs.   Primary Care Physician: Rolm Gala, MD Location: [facility] Note by: Edward Jolly, MD Date: 04/28/2020; Time: 10:21 AM  Disclaimer:  Medicine is not an exact science. The only guarantee in medicine is that nothing is guaranteed. It is important to note that the decision to proceed with this intervention was based on the information collected from the patient. The Data and conclusions were drawn from the patient's questionnaire, the interview, and the physical examination. Because the information was provided in large part by the patient, it cannot be guaranteed that it has not been purposely or unconsciously manipulated. Every effort has been made to obtain as much relevant data as possible for this evaluation. It is important to note that the conclusions that lead to this procedure are derived in large part from the available data. Always take into account that the treatment will also be dependent on availability of resources and existing treatment guidelines, considered by other Pain Management Practitioners as being common knowledge and practice, at the time of the intervention. For Medico-Legal purposes, it is also important to point out that variation in procedural techniques and pharmacological choices are the acceptable norm. The indications, contraindications, technique, and results of the above procedure should only be interpreted and judged by a Board-Certified Interventional Pain Specialist with extensive familiarity and expertise in the same exact procedure and technique.

## 2020-04-29 ENCOUNTER — Telehealth: Payer: Self-pay

## 2020-04-29 NOTE — Telephone Encounter (Signed)
Post procedure phone call.  Patient states she is doing well.  

## 2020-05-07 ENCOUNTER — Ambulatory Visit: Payer: 59 | Admitting: Student in an Organized Health Care Education/Training Program

## 2020-06-03 ENCOUNTER — Ambulatory Visit
Payer: 59 | Attending: Student in an Organized Health Care Education/Training Program | Admitting: Student in an Organized Health Care Education/Training Program

## 2020-06-03 ENCOUNTER — Encounter: Payer: Self-pay | Admitting: Student in an Organized Health Care Education/Training Program

## 2020-06-03 ENCOUNTER — Other Ambulatory Visit: Payer: Self-pay

## 2020-06-03 VITALS — BP 126/77 | HR 80 | Temp 97.8°F | Resp 16 | Ht 66.0 in | Wt 197.0 lb

## 2020-06-03 DIAGNOSIS — G588 Other specified mononeuropathies: Secondary | ICD-10-CM | POA: Diagnosis present

## 2020-06-03 DIAGNOSIS — G894 Chronic pain syndrome: Secondary | ICD-10-CM | POA: Diagnosis not present

## 2020-06-03 DIAGNOSIS — G8929 Other chronic pain: Secondary | ICD-10-CM | POA: Insufficient documentation

## 2020-06-03 DIAGNOSIS — R0789 Other chest pain: Secondary | ICD-10-CM | POA: Diagnosis not present

## 2020-06-03 MED ORDER — CAPSAICIN 0.025 % EX PADS: MEDICATED_PAD | CUTANEOUS | 0 refills | Status: DC

## 2020-06-03 NOTE — Progress Notes (Signed)
PROVIDER NOTE: Information contained herein reflects review and annotations entered in association with encounter. Interpretation of such information and data should be left to medically-trained personnel. Information provided to patient can be located elsewhere in the medical record under "Patient Instructions". Document created using STT-dictation technology, any transcriptional errors that may result from process are unintentional.    Patient: Sophia [name]  Service Category: E/M  Provider: Gillis Santa, MD  DOB: [birth year]  DOS: 06/03/2020  Specialty: Interventional Pain Management  MRN: [id number]  Setting: Ambulatory outpatient  PCP: Hortencia Pilar, MD  Type: Established Patient    Referring Provider: Hortencia Pilar, MD  Location: Office  Delivery: Face-to-face     HPI  Reason for encounter: Sophia [name], a 59 y.o. year old female, is here today for evaluation and management of her Intercostal neuralgia [G58.8]. Sophia [name] primary complain today is Flank Pain (right ribs) Last encounter: Practice (04/29/2020). My last encounter with her was on 04/28/2020. Pertinent problems: Sophia [name] has Intercostal neuralgia; Chronic chest wall pain; and Chronic pain syndrome on their pertinent problem list. Pain Assessment: Severity of Chronic pain is reported as a 0-No pain/10. Location: Rib cage Right/denies. Onset: More than a month ago. Quality: Stabbing. Timing: Intermittent. Modifying factor(s): medication, stretching. Vitals:  height is 5' 6"  (1.676 m) and weight is 197 lb (89.4 kg). Her temporal temperature is 97.8 F (36.6 C). Her blood pressure is 126/77 and her pulse is 80. Her respiration is 16 and oxygen saturation is 100%.    Post-Procedure Evaluation  Procedure (04/28/2020):   Type: Therapeutic Mid-Axillary Line Intercostal  Nerve Block  #2 (#1 done 03/03/20) Region: Mid-Axillary Line  Thoracic Area Level: Right T7,8 Ribs Laterality: Right-Sided  Sedation: Please see nurses  note.  Effectiveness during initial hour after procedure(Ultra-Short Term Relief): 100 %  Local anesthetic used: Long-acting (4-6 hours) Effectiveness: Defined as any analgesic benefit obtained secondary to the administration of local anesthetics. This carries significant diagnostic value as to the etiological location, or anatomical origin, of the pain. Duration of benefit is expected to coincide with the duration of the local anesthetic used.  Effectiveness during initial 4-6 hours after procedure(Short-Term Relief): 100 %   Long-term benefit: Defined as any relief past the pharmacologic duration of the local anesthetics.  Effectiveness past the initial 6 hours after procedure(Long-Term Relief): 50 %   Current benefits: Defined as benefit that persist at this time.   Analgesia:  50% improved Function: Somewhat improved ROM: Somewhat improved  ROS  Constitutional: Denies any fever or chills Gastrointestinal: No reported hemesis, hematochezia, vomiting, or acute GI distress Musculoskeletal: right intercostal pain Neurological: No reported episodes of acute onset apraxia, aphasia, dysarthria, agnosia, amnesia, paralysis, loss of coordination, or loss of consciousness  Medication Review  Capsaicin, acetaminophen, cyanocobalamin, fluticasone, levothyroxine, lidocaine, multivitamin with minerals, and omeprazole  History Review  Allergy: Sophia [name] is allergic to contrast media [iodinated diagnostic agents], codeine, metoprolol, phenergan [promethazine hcl], sulfa antibiotics, sulfamethoxazole-trimethoprim, tequin [gatifloxacin], and latex. Drug: Sophia [name]  reports no history of drug use. Alcohol:  reports no history of alcohol use. Tobacco:  reports that she has never smoked. She has never used smokeless tobacco. Social: Sophia [name]  reports that she has never smoked. She has never used smokeless tobacco. She reports that she does not drink alcohol and does not use drugs. Medical:  has a  past medical history of Allergic rhinitis, Asthma, mild intermittent, Mitral valve prolapse, and Thyroid disease. Surgical: Sophia [name]  has a past surgical  history that includes Splenectomy (1978); Shoulder surgery (1990s); and Toe Surgery. Family: family history includes Breast cancer in an other family member; Colon cancer in her paternal grandmother; Coronary artery disease in her mother; Diabetes in her mother; Heart failure in her mother; Hypertension in her father; Obesity in her mother; Parkinsonism in her maternal grandfather; Stroke in her maternal grandmother, paternal grandfather, and paternal grandmother.  Laboratory Chemistry Profile   Renal Lab Results  Component Value Date   BUN 10 12/16/2016   CREATININE 0.80 12/16/2016   GFRAA >60 12/16/2016   GFRNONAA >60 12/16/2016     Hepatic Lab Results  Component Value Date   AST 34 12/16/2016   ALT 34 12/16/2016   ALBUMIN 4.0 12/16/2016   ALKPHOS 108 12/16/2016     Electrolytes Lab Results  Component Value Date   NA 141 12/16/2016   K 3.6 12/16/2016   CL 106 12/16/2016   CALCIUM 8.8 (L) 12/16/2016     Bone No results found for: VD25OH, VD125OH2TOT, TI4580DX8, PJ8250NL9, 25OHVITD1, 25OHVITD2, 25OHVITD3, TESTOFREE, TESTOSTERONE   Inflammation (CRP: Acute Phase) (ESR: Chronic Phase) No results found for: CRP, ESRSEDRATE, LATICACIDVEN     Note: Above Lab results reviewed.  Recent Imaging Review  DG PAIN CLINIC C-ARM 1-60 MIN NO REPORT Fluoro was used, but no Radiologist interpretation will be provided.  Please refer to "NOTES" tab for provider progress note. Note: Reviewed        Physical Exam  General appearance: Well nourished, well developed, and well hydrated. In no apparent acute distress Mental status: Alert, oriented x 3 (person, place, & time)       Respiratory: No evidence of acute respiratory distress Eyes: PERLA Vitals: BP 126/77 (BP Location: Left Arm, Patient Position: Sitting, Cuff Size: Normal)    Pulse 80   Temp 97.8 F (36.6 C) (Temporal)   Resp 16   Ht 5' 6"  (1.676 m)   Wt 197 lb (89.4 kg)   SpO2 100%   BMI 31.80 kg/m  BMI: Estimated body mass index is 31.8 kg/m as calculated from the following:   Height as of this encounter: 5' 6"  (1.676 m).   Weight as of this encounter: 197 lb (89.4 kg). Ideal: Ideal body weight: 59.3 kg (130 lb 11.7 oz) Adjusted ideal body weight: 71.3 kg (157 lb 3.8 oz)   Intercostal, subcostal region, tender to palpation along lateral rib, likely T8-T9 distribution no erythema, discoloration, swelling, or allodynia noted. No significant pain upon superficial palpation.  Pain location has decreased in size and is more anterior than before.  Assessment   Status Diagnosis  Stable Stable Stable 1. Intercostal neuralgia   2. Chronic chest wall pain   3. Chronic pain syndrome       Plan of Care  Ms. ISLAY POLANCO has a current medication list which includes the following long-term medication(s): fluticasone, levothyroxine, and omeprazole.   Recommend patient apply capsaicin cream to right intercostal region.  Can alternate with Aspercreme.  Also discussed trial with gabapentin, low-dose.  Patient wants to hold off at this time.  Also offered patient repeat right intercostal nerve block, states that she will think about this and if her pain worsens, she will call to request.  We also discussed CBD application to her right intercostal region.  She would like to give this a try.  I have no problems with this.  Follow-up as needed.  Pharmacotherapy (Medications Ordered): Meds ordered this encounter  Medications  . Capsaicin 0.025 % PADS  Sig: Apply 1 patch to affected region up to 4 times a day. Dispense 1 package    Dispense:  1 each    Refill:  0   Orders Placed This Encounter  Procedures  . INTERCOSTAL NERVE BLOCK    Standing Status:   Standing    Number of Occurrences:   1    Standing Expiration Date:   12/04/2020    Scheduling  Instructions:     Ultrasound-guided right intercostal nerve block as needed    Order Specific Question:   Where will this procedure be performed?    Answer:   [facility]   Follow-up plan:   Return if symptoms worsen or fail to improve.     Status post right ultrasound-guided intercostal nerve block 03/03/2020, #2 04/28/20    Recent Visits Date Type Provider Dept  04/28/20 Procedure visit Gillis Santa, MD [facility]  04/08/20 Office Visit Gillis Santa, MD [facility]  Showing recent visits within past 90 days and meeting all other requirements Today's Visits Date Type Provider Dept  06/03/20 Office Visit Gillis Santa, MD [facility]  Showing today's visits and meeting all other requirements Future Appointments No visits were found meeting these conditions. Showing future appointments within next 90 days and meeting all other requirements  I discussed the assessment and treatment plan with the patient. The patient was provided an opportunity to ask questions and all were answered. The patient agreed with the plan and demonstrated an understanding of the instructions.  Patient advised to call back or seek an in-person evaluation if the symptoms or condition worsens.  Duration of encounter: 60mnutes.  Note by: BGillis Santa MD Date: 06/03/2020; Time: 1:58 PM

## 2020-06-03 NOTE — Patient Instructions (Signed)
Capsaicin topical patch What is this medicine? CAPSAICIN (cap SAY sin) is a pain reliever. It is used to treat postherpetic neuralgia (PHN) or diabetic neuropathy pain. This medicine may be used for other purposes; ask your health care provider or pharmacist if you have questions. COMMON BRAND NAME(S): Qutenza What should I tell my health care provider before I take this medicine? They need to know if you have any of these conditions:  broken or irritated skin  high blood pressure  history of heart attack or stroke  an unusual or allergic reaction to capsaicin, hot peppers, other medicines, foods, dyes, or preservatives  pregnant or trying to get pregnant  breast-feeding How should I use this medicine? This medicine is for external use only. It is applied by a health care professional in a hospital or clinic setting. Talk to your pediatrician regarding the use of this medicine in children. Special care may be needed. Overdosage: If you think you have taken too much of this medicine contact a poison control center or emergency room at once. NOTE: This medicine is only for you. Do not share this medicine with others. What if I miss a dose? This does not apply. What may interact with this medicine? Interactions are not expected. Do not use any other skin products on the affected area without asking your doctor or health care professional. This list may not describe all possible interactions. Give your health care provider a list of all the medicines, herbs, non-prescription drugs, or dietary supplements you use. Also tell them if you smoke, drink alcohol, or use illegal drugs. Some items may interact with your medicine. What should I watch for while using this medicine? Your condition will be monitored carefully while you are receiving this medicine. Your blood pressure may go up during the procedure. Do not touch the drug patch during treatment. This medicine causes red, burning skin.  You may need pain medicine for during and after the procedure. This medicine can make you more sensitive to heat for a few days after treatment. Be careful in hot showers or baths. Keep out of the sun. Exercise may make the treated skin feel hotter. Tell your doctor or healthcare professional if your symptoms do not start to get better or if they get worse. What side effects may I notice from receiving this medicine? Side effects that you should report to your doctor or health care professional as soon as possible:  allergic reactions like skin rash, itching or hives, swelling of the face, lips, or tongue  burning pain, redness that does not go away  changes in blood pressure  cough or trouble breathing  eye irritation  skin sores or thinning Side effects that usually do not require medical attention (report to your doctor or health care professional if they continue or are bothersome):  bruising  dry skin  nausea  unusual body smell This list may not describe all possible side effects. Call your doctor for medical advice about side effects. You may report side effects to FDA at 1-800-FDA-1088. Where should I keep my medicine? This drug is given in a hospital or clinic and will not be stored at home. NOTE: This sheet is a summary. It may not cover all possible information. If you have questions about this medicine, talk to your doctor, pharmacist, or health care provider.  2020 Elsevier/Gold Standard (2019-05-10 01:06:16)

## 2020-06-03 NOTE — Progress Notes (Signed)
Safety precautions to be maintained throughout the outpatient stay will include: orient to surroundings, keep bed in low position, maintain call bell within reach at all times, provide assistance with transfer out of bed and ambulation.  

## 2020-06-20 DIAGNOSIS — Z9081 Acquired absence of spleen: Secondary | ICD-10-CM | POA: Insufficient documentation

## 2020-06-23 ENCOUNTER — Ambulatory Visit: Payer: 59 | Admitting: Gastroenterology

## 2020-06-23 ENCOUNTER — Other Ambulatory Visit: Payer: Self-pay

## 2020-06-23 ENCOUNTER — Encounter: Payer: Self-pay | Admitting: Gastroenterology

## 2020-06-23 VITALS — BP 128/81 | HR 89 | Temp 97.7°F | Ht 66.0 in | Wt 198.2 lb

## 2020-06-23 DIAGNOSIS — R197 Diarrhea, unspecified: Secondary | ICD-10-CM | POA: Diagnosis not present

## 2020-06-23 DIAGNOSIS — Z9049 Acquired absence of other specified parts of digestive tract: Secondary | ICD-10-CM

## 2020-06-23 DIAGNOSIS — K9189 Other postprocedural complications and disorders of digestive system: Secondary | ICD-10-CM

## 2020-06-23 MED ORDER — COLESTIPOL HCL 1 G PO TABS: 2.0000 g | ORAL_TABLET | Freq: Every day | ORAL | 1 refills | Status: DC

## 2020-06-24 NOTE — Progress Notes (Signed)
Sophia [name] [address]  [place]  [place], [place] [address]  Main: [phone]  Fax: [phone]   Gastroenterology Consultation  Referring Provider:     Hortencia Pilar, MD Primary Care Physician:  Hortencia Pilar, MD Reason for Consultation:     Abdominal pain        HPI:    Chief complaint: Diarrhea Sophia [name] is a 59 y.o. y/o female referred for consultation & management  by Dr. Hortencia Pilar, MD.  Patient has had history of cholecystectomy a year ago and complains of diarrhea daily since then.  This is her main complaint.  The reason for the referral was abdominal pain, however, patient states that has been addressed.  She has [name] for this previously with extensive work-up that has been unrevealing.  It was finally diagnosed as musculoskeletal pain and Sophia [name] diagnosed her with intercostal neuralgia and patient has had injections in the area with good results.  Patient reports multiple loose bowel movements a day, and she always has to be close to a bathroom.  This has not improved since her cholecystectomy.  Symptoms were not present prior to cholecystectomy.  No blood in stool.  Previous records reviewed and as per Sophia [name], [facility] notes: "Clinical data: -- 01/17/12: CSY - Normal colon. -- 08/14/13: CT abd w/o contrast - 6 mm R base lung nodule. Otherwise unremarkable. -- 11/01/13: [place] abd complete - Stable mild R hydronephrosis, etiology unclear.  -- 10/29/13: H pylori serology negative. -- 02/05/15: ALP 151. Isoenzyme fractions WNL. -- 02/25/15: MRI abd w/ and w/o contrast (indic: elevated alk phos, RUQ pain x 2 years) - Normal liver. -- 03/05/15: EGD (indic: RUQ pain, dysphagia, GERD) - Irregular Z-line. Otherwise normal esophagus, stomach, and duodenum. Path: nonspecific reactive changes & chronic inflammation - negative Barrett's. -- 03/02/18: AMA, AMSA WNL. -- 03/14/18: RUQ [place] (indic: elevated liver enzymes) - Fatty  liver infiltration. -- 03/06/19: HIDA scan (indic: epigastric pain) - EF 36%.  -- 05/23/19: CCY (Dr. Andrey Farmer)."  Past Medical History:  Diagnosis Date   Allergic rhinitis    Asthma, mild intermittent    Mitral valve prolapse    Thyroid disease     Past Surgical History:  Procedure Laterality Date   SHOULDER SURGERY  1990s   Left   SPLENECTOMY  1978   TOE SURGERY      Prior to Admission medications   Medication Sig Start Date End Date Taking? Authorizing Provider  acetaminophen (TYLENOL) 325 MG tablet Take 650 mg by mouth as needed.   Yes [provider]  cyanocobalamin (,VITAMIN B-12,) 1000 MCG/ML injection Inject into the muscle. 04/25/20 04/20/21 Yes [provider]  fluticasone (FLONASE) 50 MCG/ACT nasal spray Place 2 sprays into the nose as needed.     Yes [provider]  levothyroxine (SYNTHROID) 88 MCG tablet Take 88 mcg by mouth daily before breakfast.   Yes [provider]  lidocaine (LMX) 4 % cream Apply 1 application topically as needed.   Yes [provider]  Capsaicin 0.025 % PADS Apply 1 patch to affected region up to 4 times a day. Dispense 1 package Patient not taking: Reported on 06/23/2020 06/03/20   Gillis Santa, MD  colestipol (COLESTID) 1 g tablet Take 2 tablets (2 g total) by mouth daily. 06/23/20 07/23/20  Virgel Manifold, MD  Multiple Vitamins-Minerals (MULTIVITAMIN WITH MINERALS) tablet Take 1 tablet by mouth daily. Patient not taking: Reported on 06/23/2020  [provider]  omeprazole (PRILOSEC) 20 MG capsule Take 20 mg by mouth daily. Patient not taking: Reported on 06/23/2020    [provider]    Family History  Problem Relation Age of Onset   Obesity Mother    Coronary artery disease Mother    Diabetes Mother    Heart failure Mother        CHF   Hypertension Father    Stroke Maternal Grandmother        CVA   Parkinsonism Maternal Grandfather    Colon cancer  Paternal Grandmother    Stroke Paternal Grandmother        CVA   Stroke Paternal Grandfather        CVA   Breast cancer Other        Aunt     Social History   Tobacco Use   Smoking status: Never Smoker   Smokeless tobacco: Never Used  Substance Use Topics   Alcohol use: No   Drug use: No    Allergies as of 06/23/2020 - Review Complete 06/23/2020  Allergen Reaction Noted   Contrast media [iodinated diagnostic agents] Swelling 12/17/2016   Codeine Other (See Comments) 12/17/2016   Metoprolol Other (See Comments) 03/03/2020   Phenergan [promethazine hcl] Nausea And Vomiting 02/25/2015   Sulfa antibiotics Nausea And Vomiting 03/03/2020   Sulfamethoxazole-trimethoprim     Tequin [gatifloxacin] Swelling 12/17/2016   Latex Rash 12/17/2016    Review of Systems:    All systems reviewed and negative except where noted in HPI.   Physical Exam:  BP 128/81    Pulse 89    Temp 97.7 F (36.5 C) (Oral)    Ht _0  (1.676 m)    Wt 198 lb 3.2 oz (89.9 kg)    BMI 31.99 kg/m  No LMP recorded. Patient is postmenopausal. Psych:  Alert and cooperative. Normal mood and affect. General:   Alert,  Well-developed, well-nourished, pleasant and cooperative in NAD Head:  Normocephalic and atraumatic. Eyes:  Sclera clear, no icterus.   Conjunctiva pink. Ears:  Normal auditory acuity. Nose:  No deformity, discharge, or lesions. Mouth:  No deformity or lesions,oropharynx pink & moist. Neck:  Supple; no masses or thyromegaly. Abdomen:  Normal bowel sounds.  No bruits.  Soft, non-tender and non-distended without masses, hepatosplenomegaly or hernias noted.  No guarding or rebound tenderness.    Msk:  Symmetrical without gross deformities. Good, equal movement & strength bilaterally. Pulses:  Normal pulses noted. Extremities:  No clubbing or edema.  No cyanosis. Neurologic:  Alert and oriented x3;  grossly normal neurologically. Skin:  Intact without significant lesions or rashes. No  jaundice. Lymph Nodes:  No significant cervical adenopathy. Psych:  Alert and cooperative. Normal mood and affect.   Labs: CBC    Component Value Date/Time   WBC 12.0 (H) 12/16/2016 2109   RBC 4.25 12/16/2016 2109   HGB 14.8 12/16/2016 2109   HCT 40.1 12/16/2016 2109   PLT 576 (H) 12/16/2016 2109   MCV 94.4 12/16/2016 2109   MCH 34.7 (H) 12/16/2016 2109   MCHC 36.8 (H) 12/16/2016 2109   RDW 13.3 12/16/2016 2109   CMP     Component Value Date/Time   NA 141 12/16/2016 2109   K 3.6 12/16/2016 2109   CL 106 12/16/2016 2109   CO2 28 12/16/2016 2109   GLUCOSE 106 (H) 12/16/2016 2109   BUN 10 12/16/2016 2109   CREATININE 0.80 12/16/2016 2109   CALCIUM 8.8 (L) 12/16/2016 2109  PROT 7.3 12/16/2016 2109   ALBUMIN 4.0 12/16/2016 2109   AST 34 12/16/2016 2109   ALT 34 12/16/2016 2109   ALKPHOS 108 12/16/2016 2109   BILITOT 0.7 12/16/2016 2109   GFRNONAA >60 12/16/2016 2109   GFRAA >60 12/16/2016 2109    Imaging Studies: No results found.  Assessment and Plan:   DELITA CHIQUITO is a 59 y.o. y/o female here for diarrhea  Patient has postcholecystectomy diarrhea Start Colestid at 2 g daily and increase dose if needed  Abdominal pain has been addressed as intercostal neuralgia and being managed by pain management with good results    Dr Sophia [name]  Speech recognition software was used to dictate the above note.

## 2020-07-22 ENCOUNTER — Telehealth: Payer: Self-pay

## 2020-07-22 NOTE — Telephone Encounter (Signed)
Patient called wanting to know if she is able to take her Colestipol at nighttime instead of the AM since she is also taking her thyroid medication-levothyroxine in the AM. Patient also wanted to know if she could only take Colestipol one tablet instead of two tablets. Please advise.

## 2020-07-25 NOTE — Telephone Encounter (Signed)
Pasty Spillers, MD  You 19 hours ago (2:45 PM)   Nighttime is okay. 1 tablet might not help as well as taking 2 tablets. But she can try, and if it works well then we can stay on the 1 tablet    Called patient and left her a detailed message. However, I will also send her a MyChart message with Dr. Michele Mcalpine message.

## 2020-08-10 ENCOUNTER — Other Ambulatory Visit: Payer: Self-pay | Admitting: Gastroenterology

## 2020-08-15 ENCOUNTER — Other Ambulatory Visit: Payer: Self-pay | Admitting: Gastroenterology

## 2020-09-16 ENCOUNTER — Ambulatory Visit (INDEPENDENT_AMBULATORY_CARE_PROVIDER_SITE_OTHER): Payer: 59 | Admitting: Gastroenterology

## 2020-09-16 ENCOUNTER — Encounter: Payer: Self-pay | Admitting: Gastroenterology

## 2020-09-16 ENCOUNTER — Ambulatory Visit: Payer: 59 | Admitting: Gastroenterology

## 2020-09-16 VITALS — BP 149/80 | HR 80 | Temp 97.6°F | Ht 66.0 in | Wt 195.0 lb

## 2020-09-16 DIAGNOSIS — R197 Diarrhea, unspecified: Secondary | ICD-10-CM | POA: Diagnosis not present

## 2020-09-16 DIAGNOSIS — Z9049 Acquired absence of other specified parts of digestive tract: Secondary | ICD-10-CM

## 2020-09-16 MED ORDER — COLESTIPOL HCL 1 G PO TABS: 1.0000 g | ORAL_TABLET | Freq: Every day | ORAL | 1 refills | Status: DC

## 2020-09-16 NOTE — Progress Notes (Signed)
Sophia Antigua, MD [address]  [place]  [place], [place] [address]  Main: [phone]  Fax: [phone]   Primary Care Physician: Hortencia Pilar, MD   Chief Complaint  Patient presents with  . Diarrhea    Patint stated that she had been doing well with her medication but if she stops them, she goes back to having diarrhea.    HPI: Sophia [name] is a 59 y.o. female here for follow-up of postcholecystectomy diarrhea.  Patient was started on Colestid 2 g daily on last visit and with this patient reports complete resolution of symptoms.  However, is now having some constipation.  Sometimes goes 2 to 3 days without a bowel movement and sometimes the consistency is hard.  No blood in stool.  No nausea or vomiting.  No abdominal pain.  No weight loss.  Previous history: Patient has had history of cholecystectomy a year ago and complains of diarrhea daily since then.  This is her main complaint.  The reason for the referral was abdominal pain, however, patient states that has been addressed.  She has seen [facility] for this previously with extensive work-up that has been unrevealing.  It was finally diagnosed as musculoskeletal pain and Dr. Zollie Scale diagnosed her with intercostal neuralgia and patient has had injections in the area with good results.  Patient reports multiple loose bowel movements a day, and she always has to be close to a bathroom.  This has not improved since her cholecystectomy.  Symptoms were not present prior to cholecystectomy.  No blood in stool.  Previous records reviewed and as per Tammi Klippel, [facility] GI notes: "Clinical data: -- 01/17/12: CSY - Normal colon. -- 08/14/13: CT abd w/o contrast - 6 mm R base lung nodule. Otherwise unremarkable. -- 11/01/13: [place] abd complete - Stable mild R hydronephrosis, etiology unclear.  -- 10/29/13: H pylori serology negative. -- 02/05/15: ALP 151. Isoenzyme fractions WNL. -- 02/25/15: MRI abd w/ and  w/o contrast (indic: elevated alk phos, RUQ pain x 2 years) - Normal liver. -- 03/05/15: EGD (indic: RUQ pain, dysphagia, GERD) - Irregular Z-line. Otherwise normal esophagus, stomach, and duodenum. Path: nonspecific reactive changes & chronic inflammation - negative Barrett's. -- 03/02/18: AMA, AMSA WNL. -- 03/14/18: RUQ [place] (indic: elevated liver enzymes) - Fatty liver infiltration. -- 03/06/19: HIDA scan (indic: epigastric pain) - EF 36%.  -- 05/23/19: CCY (Dr. Andrey Farmer)."  Current Outpatient Medications  Medication Sig Dispense Refill  . acetaminophen (TYLENOL) 325 MG tablet Take 650 mg by mouth as needed.    . colestipol (COLESTID) 1 g tablet Take 1 tablet (1 g total) by mouth daily. 180 tablet 1  . cyanocobalamin (,VITAMIN B-12,) 1000 MCG/ML injection Inject into the muscle.    . fluticasone (FLONASE) 50 MCG/ACT nasal spray Place 2 sprays into the nose as needed.      Marland Kitchen levothyroxine (SYNTHROID) 88 MCG tablet Take 88 mcg by mouth daily before breakfast.    . lidocaine (LMX) 4 % cream Apply 1 application topically as needed.    Marland Kitchen omeprazole (PRILOSEC) 20 MG capsule Take 20 mg by mouth daily.     . Capsaicin 0.025 % PADS Apply 1 patch to affected region up to 4 times a day. Dispense 1 package (Patient not taking: Reported on 06/23/2020) 1 each 0  . Multiple Vitamins-Minerals (MULTIVITAMIN WITH MINERALS) tablet Take 1 tablet by mouth daily. (Patient not taking: Reported on 09/16/2020)     No current facility-administered medications for  this visit.    Allergies as of 09/16/2020 - Review Complete 09/16/2020  Allergen Reaction Noted  . Contrast media [iodinated diagnostic agents] Swelling 12/17/2016  . Codeine Other (See Comments) 12/17/2016  . Metoprolol Other (See Comments) 03/03/2020  . Phenergan [promethazine hcl] Nausea And Vomiting 02/25/2015  . Sulfa antibiotics Nausea And Vomiting 03/03/2020  . Sulfamethoxazole-trimethoprim    . Tequin [gatifloxacin] Swelling 12/17/2016  . Latex  Rash 12/17/2016    ROS:  General: Negative for anorexia, weight loss, fever, chills, fatigue, weakness. ENT: Negative for hoarseness, difficulty swallowing , nasal congestion. CV: Negative for chest pain, angina, palpitations, dyspnea on exertion, peripheral edema.  Respiratory: Negative for dyspnea at rest, dyspnea on exertion, cough, sputum, wheezing.  GI: See history of present illness. GU:  Negative for dysuria, hematuria, urinary incontinence, urinary frequency, nocturnal urination.  Endo: Negative for unusual weight change.    Physical Examination:   BP (!) 149/80   Pulse 80   Temp 97.6 F (36.4 C) (Oral)   Ht $R'5\' 6"'Nr$  (1.676 m)   Wt 195 lb (88.5 kg)   BMI 31.47 kg/m   General: Well-nourished, well-developed in no acute distress.  Eyes: No icterus. Conjunctivae pink. Mouth: Oropharyngeal mucosa moist and pink , no lesions erythema or exudate. Neck: Supple, Trachea midline Abdomen: Bowel sounds are normal, nontender, nondistended, no hepatosplenomegaly or masses, no abdominal bruits or hernia , no rebound or guarding.   Extremities: No lower extremity edema. No clubbing or deformities. Neuro: Alert and oriented x 3.  Grossly intact. Skin: Warm and dry, no jaundice.   Psych: Alert and cooperative, normal mood and affect.   Labs: CMP     Component Value Date/Time   NA 141 12/16/2016 2109   K 3.6 12/16/2016 2109   CL 106 12/16/2016 2109   CO2 28 12/16/2016 2109   GLUCOSE 106 (H) 12/16/2016 2109   BUN 10 12/16/2016 2109   CREATININE 0.80 12/16/2016 2109   CALCIUM 8.8 (L) 12/16/2016 2109   PROT 7.3 12/16/2016 2109   ALBUMIN 4.0 12/16/2016 2109   AST 34 12/16/2016 2109   ALT 34 12/16/2016 2109   ALKPHOS 108 12/16/2016 2109   BILITOT 0.7 12/16/2016 2109   GFRNONAA >60 12/16/2016 2109   GFRAA >60 12/16/2016 2109   Lab Results  Component Value Date   WBC 12.0 (H) 12/16/2016   HGB 14.8 12/16/2016   HCT 40.1 12/16/2016   MCV 94.4 12/16/2016   PLT 576 (H)  12/16/2016    Imaging Studies: No results found.  Assessment and Plan:   Sophia [name] is a 59 y.o. y/o female here for follow-up of postcholecystectomy diarrhea  Patient doing well on Colestid 2 g daily However, is now also reporting some constipation.  Decrease dose to 1 g daily If patient continues to have constipation despite this, she was advised to let us know and we can alter medication or dosing as necessary  However, if she develops diarrhea again on this, we may need to alternate 1 g and 2 g dosing every other day or consider half the dose a day  Patient agreeable with above plan    Dr Sophia [name]

## 2020-10-30 ENCOUNTER — Other Ambulatory Visit: Payer: Self-pay | Admitting: Family Medicine

## 2020-10-30 DIAGNOSIS — Z1231 Encounter for screening mammogram for malignant neoplasm of breast: Secondary | ICD-10-CM

## 2020-12-01 ENCOUNTER — Ambulatory Visit
Admission: RE | Admit: 2020-12-01 | Discharge: 2020-12-01 | Disposition: A | Discharge status: Home / Self Care | Payer: 59 | Source: Ambulatory Visit | Attending: Family Medicine | Admitting: Family Medicine

## 2020-12-01 ENCOUNTER — Other Ambulatory Visit: Payer: Self-pay

## 2020-12-01 DIAGNOSIS — Z1231 Encounter for screening mammogram for malignant neoplasm of breast: Secondary | ICD-10-CM | POA: Diagnosis present

## 2021-09-22 ENCOUNTER — Other Ambulatory Visit: Payer: Self-pay | Admitting: Gastroenterology

## 2021-11-20 ENCOUNTER — Other Ambulatory Visit: Payer: Self-pay | Admitting: Family Medicine

## 2021-11-20 DIAGNOSIS — Z1231 Encounter for screening mammogram for malignant neoplasm of breast: Secondary | ICD-10-CM

## 2021-12-25 ENCOUNTER — Other Ambulatory Visit: Payer: Self-pay

## 2021-12-25 ENCOUNTER — Ambulatory Visit
Admission: RE | Admit: 2021-12-25 | Discharge: 2021-12-25 | Disposition: A | Discharge status: Home / Self Care | Payer: 59 | Source: Ambulatory Visit | Attending: Family Medicine | Admitting: Family Medicine

## 2021-12-25 DIAGNOSIS — Z1231 Encounter for screening mammogram for malignant neoplasm of breast: Secondary | ICD-10-CM | POA: Insufficient documentation

## 2022-06-28 ENCOUNTER — Ambulatory Visit
Admission: EM | Admit: 2022-06-28 | Discharge: 2022-06-28 | Disposition: A | Discharge status: Home / Self Care | Payer: 59 | Attending: Emergency Medicine | Admitting: Emergency Medicine

## 2022-06-28 DIAGNOSIS — R300 Dysuria: Secondary | ICD-10-CM

## 2022-06-28 LAB — POCT URINALYSIS DIP (MANUAL ENTRY)
Bilirubin, UA: NEGATIVE
Blood, UA: NEGATIVE
Glucose, UA: NEGATIVE mg/dL
Ketones, POC UA: NEGATIVE mg/dL
Nitrite, UA: NEGATIVE
Protein Ur, POC: NEGATIVE mg/dL
Spec Grav, UA: <=1.005 — AB (ref 1.010–1.025)
Urobilinogen, UA: 0.2 E.U./dL
pH, UA: 6 (ref 5.0–8.0)

## 2022-06-28 MED ORDER — CEPHALEXIN 500 MG PO CAPS: 500.0000 mg | ORAL_CAPSULE | Freq: Two times a day (BID) | ORAL | 0 refills | Status: AC

## 2022-06-28 NOTE — ED Provider Notes (Signed)
Renaldo Fiddler    CSN: [id number] Arrival date & time: 06/28/22  1110      History   Chief Complaint Chief Complaint  Patient presents with   Dysuria    HPI Sophia [name] is a 61 y.o. female.  Patient presents with 2-day history of dysuria, urinary frequency, urinary urgency, lower abdominal pain.  She denies fever, rash, hematuria, vaginal discharge, pelvic pain, flank pain, vomiting, diarrhea, constipation, or other symptoms.  She took Azo yesterday evening; no OTC medications today.  Her medical history includes asthma, mitral valve prolapse, first-degree AV block, thyroid disease, migraine headaches, allergic rhinitis, chronic pain syndrome, fatty liver.  The history is provided by the patient and medical records.    Past Medical History:  Diagnosis Date   Allergic rhinitis    Asthma, mild intermittent    Mitral valve prolapse    Thyroid disease     Patient Active Problem List   Diagnosis Date Noted   History of splenectomy 06/20/2020   Intercostal neuralgia 02/12/2020   Chronic chest wall pain 02/12/2020   Chronic pain syndrome 02/12/2020   History of basal cell cancer 08/13/2019   Fatty infiltration of liver 07/12/2019   1st degree AV block 05/30/2019   Transient elevated blood pressure 05/30/2019   Obesity (BMI 30.0-34.9) 02/24/2018   Low serum vitamin B12 02/07/2018   Alkaline phosphatase elevation 07/07/2015   Acquired hypothyroidism 10/26/2013   ACUTE SINUSITIS, UNSPECIFIED 10/16/2007   RASH AND OTHER NONSPECIFIC SKIN ERUPTION 09/26/2007   GOITER NOS 06/06/2007   SPHEROCYTOSIS, HEREDITARY 06/06/2007   MIGRAINE HEADACHE 06/06/2007   ALLERGIC RHINITIS 06/06/2007   ASTHMA 06/06/2007   INTERSTITIAL CYSTITIS 06/06/2007   LOW BACK PAIN, CHRONIC 06/06/2007   Personal history of unspecified circulatory disease 06/06/2007    Past Surgical History:  Procedure Laterality Date   SHOULDER SURGERY  1990s   Left   SPLENECTOMY  1978   TOE SURGERY       OB History   No obstetric history on file.      Home Medications    Prior to Admission medications   Medication Sig Start Date End Date Taking? Authorizing Provider  cephALEXin (KEFLEX) 500 MG capsule Take 1 capsule (500 mg total) by mouth 2 (two) times daily for 5 days. 06/28/22 07/03/22 Yes Mickie Bail, NP  acetaminophen (TYLENOL) 325 MG tablet Take 650 mg by mouth as needed.    [provider]  Capsaicin 0.025 % PADS Apply 1 patch to affected region up to 4 times a day. Dispense 1 package Patient not taking: Reported on 06/23/2020 06/03/20   Edward Jolly, MD  colestipol (COLESTID) 1 g tablet Take 1 tablet by mouth once daily 09/24/21   Pasty Spillers, MD  fluticasone (FLONASE) 50 MCG/ACT nasal spray Place 2 sprays into the nose as needed.      [provider]  levothyroxine (SYNTHROID) 88 MCG tablet Take 88 mcg by mouth daily before breakfast.    [provider]  lidocaine (LMX) 4 % cream Apply 1 application topically as needed.    [provider]  Multiple Vitamins-Minerals (MULTIVITAMIN WITH MINERALS) tablet Take 1 tablet by mouth daily. Patient not taking: Reported on 09/16/2020    [provider]  omeprazole (PRILOSEC) 20 MG capsule Take 20 mg by mouth daily.     [provider]    Family History Family History  Problem Relation Age of Onset   Obesity Mother    Coronary artery disease Mother  Diabetes Mother    Heart failure Mother        CHF   Hypertension Father    Stroke Maternal Grandmother        CVA   Parkinsonism Maternal Grandfather    Colon cancer Paternal Grandmother    Stroke Paternal Grandmother        CVA   Stroke Paternal Grandfather        CVA   Breast cancer Other        Aunt    Social History Social History   Tobacco Use   Smoking status: Never   Smokeless tobacco: Never  Substance Use Topics   Alcohol use: No   Drug use: No     Allergies   Contrast media [iodinated  contrast media], Codeine, Metoprolol, Phenergan [promethazine hcl], Sulfa antibiotics, Sulfamethoxazole-trimethoprim, Tequin [gatifloxacin], and Latex   Review of Systems Review of Systems  Constitutional:  Positive for chills. Negative for fever.  Gastrointestinal:  Positive for abdominal pain. Negative for constipation, diarrhea and vomiting.  Genitourinary:  Positive for dysuria, frequency and urgency. Negative for flank pain, hematuria, pelvic pain and vaginal discharge.  Skin:  Negative for color change and rash.  All other systems reviewed and are negative.    Physical Exam Triage Vital Signs ED Triage Vitals  Enc Vitals Group     BP      Pulse      Resp      Temp      Temp src      SpO2      Weight      Height      Head Circumference      Peak Flow      Pain Score      Pain Loc      Pain Edu?      Excl. in GC?    No data found.  Updated Vital Signs BP (!) 148/85   Pulse 89   Temp 98.4 F (36.9 C)   Resp 18   Ht 5\' 6"  (1.676 m)   SpO2 98%   BMI 31.47 kg/m   Visual Acuity Right Eye Distance:   Left Eye Distance:   Bilateral Distance:    Right Eye Near:   Left Eye Near:    Bilateral Near:     Physical Exam Vitals and nursing note reviewed.  Constitutional:      General: She is not in acute distress.    Appearance: Normal appearance. She is well-developed. She is not ill-appearing.  HENT:     Mouth/Throat:     Mouth: Mucous membranes are moist.  Cardiovascular:     Rate and Rhythm: Normal rate and regular rhythm.     Heart sounds: Normal heart sounds.  Pulmonary:     Effort: Pulmonary effort is normal. No respiratory distress.     Breath sounds: Normal breath sounds.  Abdominal:     General: Bowel sounds are normal.     Palpations: Abdomen is soft.     Tenderness: There is no abdominal tenderness. There is no right CVA tenderness, left CVA tenderness, guarding or rebound.  Musculoskeletal:     Cervical back: Neck supple.  Skin:    General:  Skin is warm and dry.  Neurological:     Mental Status: She is alert.  Psychiatric:        Mood and Affect: Mood normal.        Behavior: Behavior normal.      UC Treatments /  Results  Labs (all labs ordered are listed, but only abnormal results are displayed) Labs Reviewed  POCT URINALYSIS DIP (MANUAL ENTRY) - Abnormal; Notable for the following components:      Result Value   Spec Grav, UA <=1.005 (*)    Leukocytes, UA Trace (*)    All other components within normal limits  URINE CULTURE    EKG   Radiology No results found.  Procedures Procedures (including critical care time)  Medications Ordered in UC Medications - No data to display  Initial Impression / Assessment and Plan / UC Course  I have reviewed the triage vital signs and the nursing notes.  Pertinent labs & imaging results that were available during my care of the patient were reviewed by me and considered in my medical decision making (see chart for details).   Dysuria.  Treating with Keflex. Urine culture pending. Discussed with patient that we will call her if the urine culture shows the need to change or discontinue the antibiotic. Instructed her to follow-up with her PCP if her symptoms are not improving. Patient agrees to plan of care.      Final Clinical Impressions(s) / UC Diagnoses   Final diagnoses:  Dysuria     Discharge Instructions      Take the antibiotic as directed.  The urine culture is pending.  We will call you if it shows the need to change or discontinue your antibiotic.    Follow up with your primary care provider if your symptoms are not improving.        ED Prescriptions     Medication Sig Dispense Auth. Provider   cephALEXin (KEFLEX) 500 MG capsule Take 1 capsule (500 mg total) by mouth 2 (two) times daily for 5 days. 10 capsule Sharion Balloon, NP      PDMP not reviewed this encounter.   Sharion Balloon, NP 06/28/22 1158

## 2022-06-28 NOTE — ED Triage Notes (Signed)
Patient to Urgent Care with complaints of dysuria, urinary frequency/ urgency x2 days. Has been taking azo which has been helping with symptoms.

## 2022-06-28 NOTE — Discharge Instructions (Addendum)
Take the antibiotic as directed.  The urine culture is pending.  We will call you if it shows the need to change or discontinue your antibiotic.    Follow up with your primary care provider if your symptoms are not improving.    

## 2022-06-30 LAB — URINE CULTURE: Culture: 80000 — AB

## 2022-11-17 ENCOUNTER — Other Ambulatory Visit: Payer: Self-pay | Admitting: Family Medicine

## 2022-11-17 DIAGNOSIS — Z1231 Encounter for screening mammogram for malignant neoplasm of breast: Secondary | ICD-10-CM

## 2022-12-28 ENCOUNTER — Ambulatory Visit
Admission: RE | Admit: 2022-12-28 | Discharge: 2022-12-28 | Disposition: A | Discharge status: Home / Self Care | Payer: 59 | Source: Ambulatory Visit | Attending: Family Medicine | Admitting: Family Medicine

## 2022-12-28 DIAGNOSIS — Z1231 Encounter for screening mammogram for malignant neoplasm of breast: Secondary | ICD-10-CM | POA: Diagnosis not present

## 2022-12-29 ENCOUNTER — Encounter: Payer: Self-pay | Admitting: Family Medicine

## 2022-12-31 ENCOUNTER — Other Ambulatory Visit: Payer: Self-pay | Admitting: Family Medicine

## 2022-12-31 DIAGNOSIS — R928 Other abnormal and inconclusive findings on diagnostic imaging of breast: Secondary | ICD-10-CM

## 2022-12-31 DIAGNOSIS — N6489 Other specified disorders of breast: Secondary | ICD-10-CM

## 2023-01-04 ENCOUNTER — Ambulatory Visit
Admission: RE | Admit: 2023-01-04 | Discharge: 2023-01-04 | Disposition: A | Discharge status: Home / Self Care | Payer: 59 | Source: Ambulatory Visit | Attending: Family Medicine | Admitting: Family Medicine

## 2023-01-04 DIAGNOSIS — N6489 Other specified disorders of breast: Secondary | ICD-10-CM | POA: Diagnosis present

## 2023-01-04 DIAGNOSIS — R928 Other abnormal and inconclusive findings on diagnostic imaging of breast: Secondary | ICD-10-CM | POA: Diagnosis not present

## 2023-05-23 ENCOUNTER — Ambulatory Visit: Payer: 59

## 2023-05-23 DIAGNOSIS — K5289 Other specified noninfective gastroenteritis and colitis: Secondary | ICD-10-CM

## 2023-05-23 DIAGNOSIS — K640 First degree hemorrhoids: Secondary | ICD-10-CM

## 2023-12-05 ENCOUNTER — Other Ambulatory Visit: Payer: Self-pay | Admitting: Family Medicine

## 2023-12-05 DIAGNOSIS — Z1231 Encounter for screening mammogram for malignant neoplasm of breast: Secondary | ICD-10-CM

## 2023-12-30 ENCOUNTER — Ambulatory Visit
Admission: RE | Admit: 2023-12-30 | Discharge: 2023-12-30 | Disposition: A | Discharge status: Home / Self Care | Payer: 59 | Source: Ambulatory Visit | Attending: Family Medicine | Admitting: Family Medicine

## 2023-12-30 DIAGNOSIS — Z1231 Encounter for screening mammogram for malignant neoplasm of breast: Secondary | ICD-10-CM | POA: Insufficient documentation

## 2024-09-26 ENCOUNTER — Other Ambulatory Visit: Payer: Self-pay

## 2024-10-18 ENCOUNTER — Inpatient Hospital Stay
Admission: RE | Admit: 2024-10-18 | Discharge: 2024-10-18 | Disposition: A | Discharge status: Home / Self Care | Payer: Self-pay | Source: Ambulatory Visit

## 2024-10-18 NOTE — Pre-Procedure Instructions (Signed)
 Patient was seen by her cardiologist (Custovic) on 10/16/24 for c/o CP and SOB. Cardiology has ordered stress test and echo to be done on 10/22/24 and return visit on 11/14/24. Surgery with Dr Verdon scheduled for 10/25/24. Called OB/GYN office spoke to Angie and updated her on patients current situation. She will notify Dr Verdon.

## 2024-10-25 ENCOUNTER — Ambulatory Visit: Admit: 2024-10-25 | Payer: Self-pay | Admitting: Obstetrics and Gynecology

## 2024-10-25 SURGERY — HYSTERECTOMY, TOTAL, ROBOT-ASSISTED, LAPAROSCOPIC, WITH BILATERAL SALPINGO-OOPHORECTOMY
Anesthesia: Choice | Site: Bladder

## 2024-10-26 NOTE — H&P (Signed)
 " Patient ID: Sophia [name] is a 64 y.o. female presenting with preop for PMB with fibroids on 09/11/2024   HPI: History of Present Illness Sophia [name] is a 64 year old female who presents for a pre-operative consultation for a hysterectomy due to uterine fibroids.   Pelvic pressure and prolapse symptoms - Significant pressure on the bladder - Sensation of cervical prolapse - Symptoms are persistent and very uncomfortable   Uterine fibroids - Large uterus with multiple fibroids causing pelvic symptoms - Scheduled for hysterectomy due to fibroid burden   Surgical history and considerations - History of splenectomy and cholecystectomy - Concern for intra-abdominal scar tissue potentially affecting surgical approach for hysterectomy   Allergies - No allergies to Betadine   EMBX: c  Endometrial Biopsy: RARE BENIGN ENDOMETRIAL  FRAGMENTS ARE SEEN IN A SPECIMEN THAT CONSISTS MAINLY OF  MUCUS, BLOOD, AND ENDOCERVICAL GLANDS.  Pap: 10/18/23: Neg/neg   TVUS 09/11/24: Volume 200cm3 Large fibroids, normal ovaries Submucosal fibroids in cavity, intramural and several subserosal in lower uterine segment   Visualized. Size 87 mm x 68 mm x 66 mm Normal Position: retroverted         Past Medical History:  has a past medical history of Alkaline phosphatase elevation - check q6-12 mo.  has had liver MRI (07/07/2015), Arthritis, Asthma ([facility]), Fibroid, Gastritis (03/05/2015), Hereditary spherocytosis (), History of cancer (10/2023), History of splenectomy, Hypothyroidism (10/26/2013), Hypothyroidism, Irregular Z line of esophagus (03/05/2015), Liver disease (2019), Low serum vitamin B12 (02/07/2018), MVP (mitral valve prolapse), Poor intravenous access, Primary hypertension (07/13/2023), and Sleep apnea.  Past Surgical History:  has a past surgical history that includes Splenectomy; Colonoscopy (01/17/2012); SHOULDER SURGERY (1996); egd (03/05/2015); Cholecystectomy (2020); and Colon @ [facility]  (05/23/2023). Family History: family history includes Colon cancer in her maternal grandmother; Diabetes in her mother; Heart failure in her mother; No Known Problems in her brother, father, and sister; Stroke in her mother; Thyroid  disease in her mother. Social History:  reports that she has never smoked. She has never used smokeless tobacco. She reports that she does not drink alcohol and does not use drugs. OB/GYN History:  OB History       Gravida  0   Para  0   Term  0   Preterm  0   AB  0   Living  0        SAB  0   IAB  0   Ectopic  0   Molar  0   Multiple  0   Live Births  0             Allergies: is allergic to diltiazem, iodinated contrast media, amlodipine, codeine, gatifloxacin, metoprolol, phenergan dm [promethazine-dm], promethazine hcl, sulfa (sulfonamide antibiotics), sulfamethoxazole-trimethoprim, tequin [gatifloxacin in d5w], and latex. Medications:  Current Medications    Current Outpatient Medications:    atenoloL (TENORMIN) 25 MG tablet, Take 1 tablet (25 mg total) by mouth 2 (two) times daily, Disp: 60 tablet, Rfl: 11   colesevelam (WELCHOL) 625 mg tablet, Take 3 tablets (1,875 mg total) by mouth once daily (Patient taking differently: Take 1,875 mg by mouth as needed), Disp: 270 tablet, Rfl: 3   cyanocobalamin (VITAMIN B12) 1000 MCG tablet, Take 1,000 mcg by mouth once daily, Disp: , Rfl:    fluticasone propionate (FLONASE) 50 mcg/actuation nasal spray, Place 2 sprays into both nostrils once daily, Disp: , Rfl:    hydrocortisone (ANUSOL-HC) 2.5 % rectal cream, Apply topically 3 (three) times daily Until  resolves. (Patient taking differently: Apply topically 3 (three) times daily as needed Until resolves.), Disp: 30 g, Rfl: 0   levothyroxine (SYNTHROID) 88 MCG tablet, Take 1 tablet by mouth once daily, Disp: 100 tablet, Rfl: 3   lidocaine  (LIDODERM ) 5 % patch, Place 1 patch onto the skin once daily as needed Apply patch to the most painful area  once in a 24 hour period., Disp: 30 patch, Rfl: 3   celecoxib (CELEBREX) 200 MG capsule, Take 1 capsule (200 mg total) by mouth 2 (two) times daily (Patient not taking: Reported on 09/11/2024), Disp: 60 capsule, Rfl: 0     Review of Systems: No SOB, no palpitations or chest pain, no new lower extremity edema, no nausea or vomiting or bowel or bladder complaints. See HPI for gyn specific ROS.    Exam:    Objective BP 132/78   Pulse 58   Ht 167.6 cm (5' 6)   Wt 91.1 kg (200 lb 12.8 oz)   LMP  (LMP Unknown)   BMI 32.41 kg/m    General: Patient is well-groomed, well-nourished, appears stated age in no acute distress   HEENT: head is atraumatic and normocephalic, trachea is midline, neck is supple with no palpable nodules   CV: Regular rhythm and normal heart rate, no murmur   Pulm: Clear to auscultation throughout lung fields with no wheezing, crackles, or rhonchi. No increased work of breathing   Abdomen: soft , no mass, non-tender, no rebound tenderness, no hepatomegaly   Pelvic: tanner stage 5 ,              External genitalia: vulva /labia no lesions             Urethra: no prolapse             Vagina: normal physiologic d/c, laxity in vaginal walls             Cervix: no lesions, no cervical motion tenderness             Uterus: enlarged and firm             Adnexa: no mass,  non-tender               Rectovaginal: External wnl   A female chaperone was present for the more sensitive portions of the physical exam ( such as breast and pelvic)   Endometrial biopsy: The cervix was cleaned with betadine, topical Hurriciane spray applied, and a single tooth tenaculum is applied to the anterior cervix. Dilation was required. The Pipelle catheter was placed into the endometrial cavity. It sounds to 6 cm and adequate tissue was removed.  Chaperone present for pelvic exam.      Impression:    The primary encounter diagnosis was PMB (postmenopausal bleeding). A diagnosis of  Intramural and subserous leiomyoma of uterus was also pertinent to this visit.       Plan:    Patient returns for a preoperative discussion regarding her plans to proceed with definitive surgical treatment of her fibroids by  robotic assisted total laparoscopic hysterectomy with bilateral salpingoophorectomy, cystoscopy. Will consider stents or ICgreen with urology prior due to lateral placement of fibroids.   Expect Excite technique and will need bag Have TXA available in room We discussed Pfannenstiel if needed. Expect LUQ scarring   Ultrasound images read today   The patient and I discussed the technical aspects of the procedure including the potential for risks and complications.  These include but are  not limited to the risk of infection requiring post-operative antibiotics or further procedures.  We talked about the risk of injury to adjacent organs including bladder, bowel, ureter, blood vessels or nerves.  We talked about the need to convert to an open incision.  We talked about the possible need for blood transfusion.  We talked about postop complications such as thromboembolic or cardiopulmonary complications.  All of her questions were answered.  Her preoperative exam was completed and the appropriate consents were signed. She is scheduled to undergo this procedure in the near future. "

## 2024-11-08 ENCOUNTER — Other Ambulatory Visit: Payer: Self-pay

## 2024-11-08 ENCOUNTER — Encounter
Admission: RE | Admit: 2024-11-08 | Discharge: 2024-11-08 | Disposition: A | Discharge status: Home / Self Care | Source: Ambulatory Visit | Attending: Obstetrics and Gynecology | Admitting: Obstetrics and Gynecology

## 2024-11-08 DIAGNOSIS — Z01810 Encounter for preprocedural cardiovascular examination: Secondary | ICD-10-CM

## 2024-11-08 DIAGNOSIS — N950 Postmenopausal bleeding: Secondary | ICD-10-CM

## 2024-11-08 HISTORY — DX: Malignant (primary) neoplasm, unspecified: C80.1

## 2024-11-08 HISTORY — DX: Benign neoplasm of connective and other soft tissue, unspecified: D21.9

## 2024-11-08 HISTORY — DX: Unspecified osteoarthritis, unspecified site: M19.90

## 2024-11-08 HISTORY — DX: Hypothyroidism, unspecified: E03.9

## 2024-11-08 HISTORY — DX: Nausea with vomiting, unspecified: R11.2

## 2024-11-08 HISTORY — DX: Angina pectoris, unspecified: I20.9

## 2024-11-08 HISTORY — DX: Sleep apnea, unspecified: G47.30

## 2024-11-08 HISTORY — DX: Dyspnea, unspecified: R06.00

## 2024-11-08 HISTORY — DX: Essential (primary) hypertension: I10

## 2024-11-08 NOTE — Patient Instructions (Addendum)
 Your procedure is scheduled on: 11/15/24 - Thursday Report to the Registration Desk on the 1st floor of the [facility]. To find out your arrival time, please call [phone] between 1PM - 3PM on: 11/14/24 - Wednesday If your arrival time is 6:00 am, do not arrive before that time as the [facility] entrance doors do not open until 6:00 am.  REMEMBER: Instructions that are not followed completely may result in serious medical risk, up to and including death; or upon the discretion of your surgeon and anesthesiologist your surgery may need to be rescheduled.  Do not eat food after midnight the night before surgery.  No gum chewing or hard candies.  You may however, drink CLEAR liquids up to 2 hours before you are scheduled to arrive for your surgery. Do not drink anything within 2 hours of your scheduled arrival time.  Clear liquids include: - water  - apple juice without pulp - gatorade (not RED colors) - black coffee or tea (Do NOT add milk or creamers to the coffee or tea) Do NOT drink anything that is not on this list.  One week prior to surgery: Stop Anti-inflammatories (NSAIDS) such as Advil, Aleve, Ibuprofen, Motrin, Naproxen, Naprosyn and Aspirin based products such as Excedrin, Goody's Powder, BC Powder. You may take Tylenol if needed for pain up until the day of surgery.  Stop ANY OVER THE COUNTER supplements until after surgery.  ON THE DAY OF SURGERY ONLY TAKE THESE MEDICATIONS WITH SIPS OF WATER:  atenolol (TENORMIN)  levothyroxine (SYNTHROID)  fluticasone (FLONASE)    No Alcohol for 24 hours before or after surgery.  No Smoking including e-cigarettes for 24 hours before surgery.  No chewable tobacco products for at least 6 hours before surgery.  No nicotine patches on the day of surgery.  Do not use any recreational drugs for at least a week (preferably 2 weeks) before your surgery.  Please be advised that the combination of cocaine and anesthesia may have  negative outcomes, up to and including death. If you test positive for cocaine, your surgery will be cancelled.  On the morning of surgery brush your teeth with toothpaste and water, you may rinse your mouth with mouthwash if you wish. Do not swallow any toothpaste or mouthwash.  Use CHG Soap or wipes as directed on instruction sheet.  Do not wear jewelry, make-up, hairpins, clips or nail polish.  For welded (permanent) jewelry: bracelets, anklets, waist bands, etc.  Please have this removed prior to surgery.  If it is not removed, there is a chance that hospital personnel will need to cut it off on the day of surgery.  Do not wear lotions, powders, or perfumes.   Do not shave body hair from the neck down 48 hours before surgery.  Contact lenses, hearing aids and dentures may not be worn into surgery.  Do not bring valuables to the hospital. [facility] is not responsible for any missing/lost belongings or valuables.   Notify your doctor if there is any change in your medical condition (cold, fever, infection).  Wear comfortable clothing (specific to your surgery type) to the hospital.  After surgery, you can help prevent lung complications by doing breathing exercises.  Take deep breaths and cough every 1-2 hours. Your doctor may order a device called an Incentive Spirometer to help you take deep breaths.  When coughing or sneezing, hold a pillow firmly against your incision with both hands. This is called splinting. Doing this helps protect your  incision. It also decreases belly discomfort.  If you are being admitted to the hospital overnight, leave your suitcase in the car. After surgery it may be brought to your room.  In case of increased patient census, it may be necessary for you, the patient, to continue your postoperative care in the Same Day Surgery department.  If you are being discharged the day of surgery, you will not be allowed to drive home. You will need a  responsible individual to drive you home and stay with you for 24 hours after surgery.   If you are taking public transportation, you will need to have a responsible individual with you.  Please call the Pre-admissions Testing Dept. at [phone] if you have any questions about these instructions.  Surgery Visitation Policy:  Patients having surgery or a procedure may have two visitors.  Children under the age of 18 must have an adult with them who is not the patient.  Inpatient Visitation:    Visiting hours are 7 a.m. to 8 p.m. Up to four visitors are allowed at one time in a patient room. The visitors may rotate out with other people during the day.  One visitor age 22 or older may stay with the patient overnight and must be in the room by 8 p.m.   Merchandiser, Retail to address health-related social needs:  [email or web address]                                                                                                            Preparing for Surgery with CHLORHEXIDINE GLUCONATE (CHG) Soap  Chlorhexidine Gluconate (CHG) Soap  o An antiseptic cleaner that kills germs and bonds with the skin to continue killing germs even after washing  o Used for showering the night before surgery and morning of surgery  Before surgery, you can play an important role by reducing the number of germs on your skin.  CHG (Chlorhexidine gluconate) soap is an antiseptic cleanser which kills germs and bonds with the skin to continue killing germs even after washing.  Please do not use if you have an allergy to CHG or antibacterial soaps. If your skin becomes reddened/irritated stop using the CHG.  1. Shower the NIGHT BEFORE SURGERY with CHG soap.  2. If you choose to wash your hair, wash your hair first as usual with your normal shampoo.  3. After shampooing, rinse your hair and body thoroughly to remove the shampoo.  4. Use CHG as you would any other liquid soap. You  can apply CHG directly to the skin and wash gently with a clean washcloth.  5. Apply the CHG soap to your body only from the neck down. Do not use on open wounds or open sores. Avoid contact with your eyes, ears, mouth, and genitals (private parts). Wash face and genitals (private parts) with your normal soap.  6. Wash thoroughly, paying special attention to the area where your surgery will be performed.  7. Thoroughly rinse your body with warm water.  8. Do not shower/wash with  your normal soap after using and rinsing off the CHG soap.  9. Do not use lotions, oils, etc., after showering with CHG.  10. Pat yourself dry with a clean towel.  11. Wear clean pajamas to bed the night before surgery.  12. Place clean sheets on your bed the night of your shower and do not sleep with pets.  13. Do not apply any deodorants/lotions/powders.  14. Please wear clean clothes to the hospital.  15. Remember to brush your teeth with your regular toothpaste.

## 2024-11-09 ENCOUNTER — Encounter
Admission: RE | Admit: 2024-11-09 | Discharge: 2024-11-09 | Disposition: A | Source: Ambulatory Visit | Attending: Obstetrics and Gynecology | Admitting: Obstetrics and Gynecology

## 2024-11-09 DIAGNOSIS — D251 Intramural leiomyoma of uterus: Secondary | ICD-10-CM | POA: Insufficient documentation

## 2024-11-09 DIAGNOSIS — D250 Submucous leiomyoma of uterus: Secondary | ICD-10-CM | POA: Insufficient documentation

## 2024-11-09 DIAGNOSIS — D252 Subserosal leiomyoma of uterus: Secondary | ICD-10-CM | POA: Insufficient documentation

## 2024-11-09 DIAGNOSIS — Z01812 Encounter for preprocedural laboratory examination: Secondary | ICD-10-CM | POA: Diagnosis present

## 2024-11-09 DIAGNOSIS — Z01810 Encounter for preprocedural cardiovascular examination: Secondary | ICD-10-CM

## 2024-11-09 LAB — BASIC METABOLIC PANEL WITH GFR
Anion gap: 12 (ref 5–15)
BUN: 7 mg/dL — ABNORMAL LOW (ref 8–23)
CO2: 27 mmol/L (ref 22–32)
Calcium: 8.9 mg/dL (ref 8.9–10.3)
Chloride: 105 mmol/L (ref 98–111)
Creatinine, Ser: 0.77 mg/dL (ref 0.44–1.00)
GFR, Estimated: >60 mL/min
Glucose, Bld: 92 mg/dL (ref 70–99)
Potassium: 3.8 mmol/L (ref 3.5–5.1)
Sodium: 144 mmol/L (ref 135–145)

## 2024-11-09 LAB — TYPE AND SCREEN
ABO/RH(D): AB NEG
Antibody Screen: NEGATIVE

## 2024-11-09 LAB — CBC
HCT: 40.7 % (ref 36.0–46.0)
Hemoglobin: 15.1 g/dL — ABNORMAL HIGH (ref 12.0–15.0)
MCH: 34.6 pg — ABNORMAL HIGH (ref 26.0–34.0)
MCHC: 37.1 g/dL — ABNORMAL HIGH (ref 30.0–36.0)
MCV: 93.3 fL (ref 80.0–100.0)
Platelets: 545 10*3/uL — ABNORMAL HIGH (ref 150–400)
RBC: 4.36 MIL/uL (ref 3.87–5.11)
RDW: 11.5 % (ref 11.5–15.5)
WBC: 7.3 10*3/uL (ref 4.0–10.5)
nRBC: 0 % (ref 0.0–0.2)

## 2024-11-15 ENCOUNTER — Ambulatory Visit
Admission: RE | Admit: 2024-11-15 | Discharge: 2024-11-15 | Disposition: A | Source: Home / Self Care | Attending: Obstetrics and Gynecology | Admitting: Obstetrics and Gynecology

## 2024-11-15 ENCOUNTER — Encounter
Admission: RE | Disposition: A | Discharge status: Home / Self Care | Payer: Self-pay | Source: Home / Self Care | Attending: Obstetrics and Gynecology

## 2024-11-15 ENCOUNTER — Ambulatory Visit: Admitting: Certified Registered Nurse Anesthetist

## 2024-11-15 ENCOUNTER — Other Ambulatory Visit: Payer: Self-pay

## 2024-11-15 ENCOUNTER — Encounter: Payer: Self-pay | Admitting: Obstetrics and Gynecology

## 2024-11-15 ENCOUNTER — Ambulatory Visit: Payer: Self-pay | Admitting: Urgent Care

## 2024-11-15 DIAGNOSIS — D259 Leiomyoma of uterus, unspecified: Secondary | ICD-10-CM

## 2024-11-15 DIAGNOSIS — D250 Submucous leiomyoma of uterus: Secondary | ICD-10-CM

## 2024-11-15 DIAGNOSIS — D251 Intramural leiomyoma of uterus: Secondary | ICD-10-CM

## 2024-11-15 LAB — ABO/RH: ABO/RH(D): AB NEG

## 2024-11-15 MED ORDER — OXYCODONE HCL 5 MG PO TABS
ORAL_TABLET | ORAL | Status: AC
  Filled 2024-11-15: qty 1

## 2024-11-15 MED ORDER — CELECOXIB 200 MG PO CAPS: 200.0000 mg | ORAL_CAPSULE | Freq: Two times a day (BID) | ORAL | 0 refills | Status: AC

## 2024-11-15 MED ORDER — SODIUM CHLORIDE 0.9 % IR SOLN
Status: DC | PRN
  Administered 2024-11-15: 200 mL via INTRAVESICAL

## 2024-11-15 MED ORDER — SODIUM CHLORIDE (PF) 0.9 % IJ SOLN
INTRAMUSCULAR | Status: AC
  Filled 2024-11-15: qty 50

## 2024-11-15 MED ORDER — CHLORHEXIDINE GLUCONATE 0.12 % MT SOLN
OROMUCOSAL | Status: AC
  Filled 2024-11-15: qty 15

## 2024-11-15 MED ORDER — DOCUSATE SODIUM 100 MG PO CAPS: 100.0000 mg | ORAL_CAPSULE | Freq: Two times a day (BID) | ORAL | 0 refills | Status: AC

## 2024-11-15 MED ORDER — SODIUM CHLORIDE 0.9 % IR SOLN
Status: DC | PRN
  Administered 2024-11-15: 1000 mL

## 2024-11-15 MED ORDER — TRANEXAMIC ACID-NACL 1000-0.7 MG/100ML-% IV SOLN
INTRAVENOUS | Status: AC
  Filled 2024-11-15: qty 100

## 2024-11-15 MED ORDER — CEFAZOLIN SODIUM-DEXTROSE 2-4 GM/100ML-% IV SOLN
2.0000 g | INTRAVENOUS | Status: AC
  Administered 2024-11-15: 2 g via INTRAVENOUS

## 2024-11-15 MED ORDER — FENTANYL CITRATE (PF) 100 MCG/2ML IJ SOLN
INTRAMUSCULAR | Status: AC
  Filled 2024-11-15: qty 2

## 2024-11-15 MED ORDER — TRANEXAMIC ACID-NACL 1000-0.7 MG/100ML-% IV SOLN
1000.0000 mg | Freq: Once | INTRAVENOUS | Status: AC
  Administered 2024-11-15: 1000 mg via INTRAVENOUS

## 2024-11-15 MED ORDER — GABAPENTIN 300 MG PO CAPS: 300.0000 mg | ORAL_CAPSULE | Freq: Every day | ORAL | 0 refills | Status: AC

## 2024-11-15 MED ORDER — PROPOFOL 10 MG/ML IV BOLUS
INTRAVENOUS | Status: AC
  Filled 2024-11-15: qty 20

## 2024-11-15 MED ORDER — ROCURONIUM BROMIDE 10 MG/ML (PF) SYRINGE
PREFILLED_SYRINGE | INTRAVENOUS | Status: AC
  Filled 2024-11-15: qty 10

## 2024-11-15 MED ORDER — PROPOFOL 1000 MG/100ML IV EMUL
INTRAVENOUS | Status: AC
  Filled 2024-11-15: qty 100

## 2024-11-15 MED ORDER — KETAMINE HCL 50 MG/5ML IJ SOSY
PREFILLED_SYRINGE | INTRAMUSCULAR | Status: AC
  Filled 2024-11-15: qty 5

## 2024-11-15 MED ORDER — FENTANYL CITRATE (PF) 100 MCG/2ML IJ SOLN
25.0000 ug | INTRAMUSCULAR | Status: DC | PRN
  Administered 2024-11-15 (×4): 25 ug via INTRAVENOUS

## 2024-11-15 MED ORDER — CEFAZOLIN SODIUM-DEXTROSE 2-4 GM/100ML-% IV SOLN
INTRAVENOUS | Status: AC
  Filled 2024-11-15: qty 100

## 2024-11-15 MED ORDER — ROCURONIUM BROMIDE 100 MG/10ML IV SOLN
INTRAVENOUS | Status: DC | PRN
  Administered 2024-11-15: 50 mg via INTRAVENOUS
  Administered 2024-11-15: 30 mg via INTRAVENOUS
  Administered 2024-11-15 (×2): 20 mg via INTRAVENOUS

## 2024-11-15 MED ORDER — ORAL CARE MOUTH RINSE: 15.0000 mL | Freq: Once | OROMUCOSAL | Status: AC

## 2024-11-15 MED ORDER — LIDOCAINE HCL (CARDIAC) PF 100 MG/5ML IV SOSY
PREFILLED_SYRINGE | INTRAVENOUS | Status: DC | PRN
  Administered 2024-11-15: 100 mg via INTRAVENOUS

## 2024-11-15 MED ORDER — 0.9 % SODIUM CHLORIDE (POUR BTL) OPTIME
TOPICAL | Status: DC | PRN
  Administered 2024-11-15: 500 mL

## 2024-11-15 MED ORDER — MIDAZOLAM HCL (PF) 2 MG/2ML IJ SOLN
INTRAMUSCULAR | Status: DC | PRN
  Administered 2024-11-15: 2 mg via INTRAVENOUS

## 2024-11-15 MED ORDER — METHYLENE BLUE (ANTIDOTE) 1 % IV SOLN
INTRAVENOUS | Status: DC | PRN
  Administered 2024-11-15: .5 mL

## 2024-11-15 MED ORDER — ACETAMINOPHEN 500 MG PO TABS
1000.0000 mg | ORAL_TABLET | ORAL | Status: AC
  Administered 2024-11-15: 1000 mg via ORAL

## 2024-11-15 MED ORDER — ALBUTEROL SULFATE HFA 108 (90 BASE) MCG/ACT IN AERS
INHALATION_SPRAY | RESPIRATORY_TRACT | Status: DC | PRN
  Administered 2024-11-15: 3 via RESPIRATORY_TRACT
  Administered 2024-11-15: 2 via RESPIRATORY_TRACT

## 2024-11-15 MED ORDER — LACTATED RINGERS IV SOLN: INTRAVENOUS | Status: DC

## 2024-11-15 MED ORDER — BUPIVACAINE HCL (PF) 0.5 % IJ SOLN
INTRAMUSCULAR | Status: AC
  Filled 2024-11-15: qty 30

## 2024-11-15 MED ORDER — ONDANSETRON HCL 4 MG/2ML IJ SOLN
INTRAMUSCULAR | Status: AC
  Filled 2024-11-15: qty 2

## 2024-11-15 MED ORDER — METRONIDAZOLE 500 MG/100ML IV SOLN
500.0000 mg | INTRAVENOUS | Status: AC
  Administered 2024-11-15: 500 mg via INTRAVENOUS
  Filled 2024-11-15: qty 100

## 2024-11-15 MED ORDER — VASOPRESSIN 20 UNIT/ML IV SOLN
INTRAVENOUS | Status: DC | PRN
  Administered 2024-11-15: 12 mL via SURGICAL_CAVITY

## 2024-11-15 MED ORDER — PROPOFOL 500 MG/50ML IV EMUL
INTRAVENOUS | Status: DC | PRN
  Administered 2024-11-15: 100 ug/kg/min via INTRAVENOUS

## 2024-11-15 MED ORDER — ACETAMINOPHEN 500 MG PO TABS
ORAL_TABLET | ORAL | Status: AC
  Filled 2024-11-15: qty 2

## 2024-11-15 MED ORDER — LIDOCAINE HCL (PF) 2 % IJ SOLN
INTRAMUSCULAR | Status: AC
  Filled 2024-11-15: qty 5

## 2024-11-15 MED ORDER — BUPIVACAINE HCL (PF) 0.5 % IJ SOLN
INTRAMUSCULAR | Status: DC | PRN
  Administered 2024-11-15: 20 mL

## 2024-11-15 MED ORDER — CHLORHEXIDINE GLUCONATE 0.12 % MT SOLN
15.0000 mL | Freq: Once | OROMUCOSAL | Status: AC
  Administered 2024-11-15: 15 mL via OROMUCOSAL

## 2024-11-15 MED ORDER — FENTANYL CITRATE (PF) 100 MCG/2ML IJ SOLN
INTRAMUSCULAR | Status: DC | PRN
  Administered 2024-11-15 (×2): 50 ug via INTRAVENOUS

## 2024-11-15 MED ORDER — SUGAMMADEX SODIUM 200 MG/2ML IV SOLN
INTRAVENOUS | Status: DC | PRN
  Administered 2024-11-15: 181.4 mg via INTRAVENOUS

## 2024-11-15 MED ORDER — KETAMINE HCL 10 MG/ML IJ SOLN
INTRAMUSCULAR | Status: DC | PRN
  Administered 2024-11-15 (×2): 10 mg via INTRAVENOUS
  Administered 2024-11-15: 30 mg via INTRAVENOUS

## 2024-11-15 MED ORDER — ONDANSETRON HCL 4 MG/2ML IJ SOLN
INTRAMUSCULAR | Status: DC | PRN
  Administered 2024-11-15: 4 mg via INTRAVENOUS

## 2024-11-15 MED ORDER — DEXAMETHASONE SOD PHOSPHATE PF 10 MG/ML IJ SOLN
INTRAMUSCULAR | Status: DC | PRN
  Administered 2024-11-15: 10 mg via INTRAVENOUS

## 2024-11-15 MED ORDER — INDOCYANINE GREEN 25 MG IJ SOLR
INTRAMUSCULAR | Status: DC | PRN
  Administered 2024-11-15: 50 mg via INTRAVENOUS

## 2024-11-15 MED ORDER — METHYLENE BLUE (ANTIDOTE) 1 % IV SOLN
INTRAVENOUS | Status: AC
  Filled 2024-11-15: qty 10

## 2024-11-15 MED ORDER — MIDAZOLAM HCL 2 MG/2ML IJ SOLN
INTRAMUSCULAR | Status: AC
  Filled 2024-11-15: qty 2

## 2024-11-15 MED ORDER — OXYCODONE HCL 5 MG PO TABS: 5.0000 mg | ORAL_TABLET | ORAL | 0 refills | Status: AC | PRN

## 2024-11-15 MED ORDER — POVIDONE-IODINE 10 % EX SWAB: 2.0000 | Freq: Once | CUTANEOUS | Status: DC

## 2024-11-15 MED ORDER — ACETAMINOPHEN EXTRA STRENGTH 500 MG PO TABS: 1000.0000 mg | ORAL_TABLET | Freq: Four times a day (QID) | ORAL | 0 refills | Status: AC

## 2024-11-15 MED ORDER — PROPOFOL 10 MG/ML IV BOLUS
INTRAVENOUS | Status: DC | PRN
  Administered 2024-11-15: 20 mg via INTRAVENOUS
  Administered 2024-11-15: 180 mg via INTRAVENOUS

## 2024-11-15 MED ORDER — ALBUTEROL SULFATE HFA 108 (90 BASE) MCG/ACT IN AERS
INHALATION_SPRAY | RESPIRATORY_TRACT | Status: AC
  Filled 2024-11-15: qty 6.7

## 2024-11-15 MED ORDER — DEXAMETHASONE SOD PHOSPHATE PF 10 MG/ML IJ SOLN
INTRAMUSCULAR | Status: AC
  Filled 2024-11-15: qty 1

## 2024-11-15 MED ORDER — OXYCODONE HCL 5 MG PO TABS
5.0000 mg | ORAL_TABLET | Freq: Once | ORAL | Status: AC | PRN
  Administered 2024-11-15: 5 mg via ORAL

## 2024-11-15 MED ORDER — VASOPRESSIN 20 UNIT/ML IV SOLN
INTRAVENOUS | Status: AC
  Filled 2024-11-15: qty 1

## 2024-11-15 MED ORDER — ONDANSETRON 4 MG PO TBDP: 4.0000 mg | ORAL_TABLET | Freq: Three times a day (TID) | ORAL | 0 refills | Status: AC | PRN

## 2024-11-15 MED ORDER — OXYCODONE HCL 5 MG/5ML PO SOLN: 5.0000 mg | Freq: Once | ORAL | Status: AC | PRN

## 2024-11-15 NOTE — Interval H&P Note (Signed)
 History and Physical Interval Note:  11/15/2024 10:40 AM  Sophia [name]  has presented today for surgery, with the diagnosis of postmenopausal bleeding fibroids.  The various methods of treatment have been discussed with the patient and family. After consideration of risks, benefits and other options for treatment, the patient has consented to  Procedures: HYSTERECTOMY, TOTAL, ROBOT-ASSISTED, LAPAROSCOPIC, WITH BILATERAL SALPINGO-OOPHORECTOMY (Bilateral) CYSTOSCOPY (N/A) CYSTOSCOPY WITH INDOCYANINE GREEN  IMAGING (ICG) (N/A) as a surgical intervention.  The patient's history has been reviewed, patient examined, no change in status, stable for surgery.  I have reviewed the patient's chart and labs.  Questions were answered to the patient's satisfaction.     Heather Penton

## 2024-11-15 NOTE — Transfer of Care (Signed)
 Immediate Anesthesia Transfer of Care Note  Patient: Sophia [name]  Procedure(s) Performed: HYSTERECTOMY, TOTAL, ROBOT-ASSISTED, LAPAROSCOPIC, WITH BILATERAL SALPINGO-OOPHORECTOMY; RIGHT URETROLYSIS (Bilateral: Abdomen) CYSTOSCOPY WITH INDOCYANINE GREEN  IMAGING (ICG) (Ureter)  Patient Location: PACU  Anesthesia Type:General  Level of Consciousness: drowsy  Airway & Oxygen Therapy: \Patient spontaneously breathing  Post-op Assessment: Report given to RN and Post -op Vital signs reviewed and stable  Post vital signs: Reviewed and stable  Last Vitals:  Vitals Value Taken Time  BP 131/67 11/15/24 15:32  Temp    Pulse 78 11/15/24 15:37  Resp 24 11/15/24 15:37  SpO2 100 % 11/15/24 15:37  Vitals shown include unfiled device data.  Last Pain:  Vitals:   11/15/24 0921  TempSrc: Temporal  PainSc: 0-No pain         Complications: No notable events documented.

## 2024-11-15 NOTE — Anesthesia Preprocedure Evaluation (Signed)
"                                    Anesthesia Evaluation  Patient identified by MRN, date of birth, ID band Patient awake    Reviewed: Allergy & Precautions, NPO status , Patient's Chart, lab work & pertinent test results  History of Anesthesia Complications (+) PONV and history of anesthetic complications  Airway Mallampati: III  TM Distance: >3 FB Neck ROM: full    Dental no notable dental hx.    Pulmonary asthma , sleep apnea    Pulmonary exam normal        Cardiovascular hypertension, On Medications and On Home Beta Blockers negative cardio ROS Normal cardiovascular exam     Neuro/Psych  Headaches  negative psych ROS   GI/Hepatic negative GI ROS, Neg liver ROS,,,  Endo/Other  Hypothyroidism    Renal/GU      Musculoskeletal  (+) Arthritis ,    Abdominal   Peds  Hematology  (+) Blood dyscrasia, anemia   Anesthesia Other Findings Past Medical History: No date: Allergic rhinitis No date: Anginal pain No date: Arthritis No date: Asthma, mild intermittent No date: Cancer (HCC)     Comment:  squamous, basal No date: Dyspnea No date: Fibroids     Comment:  fibroids uterine No date: Hypertension No date: Hypothyroidism No date: Mitral valve prolapse No date: PONV (postoperative nausea and vomiting) No date: Sleep apnea No date: Thyroid  disease  Past Surgical History: No date: CHOLECYSTECTOMY 2025: COLONOSCOPY No date: ESOPHAGOGASTRODUODENOSCOPY 1990s: SHOULDER SURGERY     Comment:  Left 1978: SPLENECTOMY No date: TOE SURGERY     Reproductive/Obstetrics negative OB ROS                              Anesthesia Physical Anesthesia Plan  ASA: 3  Anesthesia Plan: General ETT   Post-op Pain Management: Toradol IV (intra-op)*, Ofirmev  IV (intra-op)*, Dilaudid IV and Ketamine  IV*   Induction: Intravenous  PONV Risk Score and Plan: 4 or greater and Ondansetron , Dexamethasone , Midazolam , Treatment may vary  due to age or medical condition, Propofol  infusion and TIVA  Airway Management Planned: Oral ETT  Additional Equipment:   Intra-op Plan:   Post-operative Plan: Extubation in OR  Informed Consent: I have reviewed the patients History and Physical, chart, labs and discussed the procedure including the risks, benefits and alternatives for the proposed anesthesia with the patient or authorized representative who has indicated his/her understanding and acceptance.     Dental Advisory Given  Plan Discussed with: Anesthesiologist, CRNA and Surgeon  Anesthesia Plan Comments: (Patient consented for risks of anesthesia including but not limited to:  - adverse reactions to medications - damage to eyes, teeth, lips or other oral mucosa - nerve damage due to positioning  - sore throat or hoarseness - Damage to heart, brain, nerves, lungs, other parts of body or loss of life  Patient voiced understanding and assent.)        Anesthesia Quick Evaluation  "

## 2024-11-15 NOTE — Interval H&P Note (Signed)
 History and Physical Interval Note:  11/15/2024 10:18 AM  Sophia [name]  has presented today for surgery, with the diagnosis of postmenopausal bleeding fibroids.  The various methods of treatment have been discussed with the patient and family. After consideration of risks, benefits and other options for treatment, the patient has consented to  Procedures: HYSTERECTOMY, TOTAL, ROBOT-ASSISTED, LAPAROSCOPIC, WITH BILATERAL SALPINGO-OOPHORECTOMY (Bilateral) CYSTOSCOPY (N/A) CYSTOSCOPY WITH INDOCYANINE GREEN  IMAGING (ICG) (N/A) as a surgical intervention.  The patient's history has been reviewed, patient examined, no change in status, stable for surgery.  I have reviewed the patient's chart and labs.  Questions were answered to the patient's satisfaction.    Cystoscopy with intra-ureteral ICG requested to aid in intraoperative ureteral localization.  The procedure was discussed.  All questions were answered   Sophia [name]

## 2024-11-15 NOTE — Op Note (Signed)
" ° °  Preoperative diagnosis:  Uterine fibroids  Postoperative diagnosis:  Same  Procedure: Cystoscopy with indocyanine green  imaging  Surgeon: Glendia JAYSON Barba, MD  Anesthesia: General  Complications: None  Intraoperative findings:  Cystoscopy: Bladder mucosa without erythema, solid or papillary lesions.  UOs normal-appearing bilaterally.  EBL: None  Specimens: None  Indication: Sophia [name] is a 64 y.o. patient with uterine fibroid scheduled for robotic assisted laparoscopic hysterectomy by Dr. Verdon.  Instillation of intraureteral ICG has been requested to aid in intraoperative ureteral/bladder localization.  Informed consent has been obtained.  Description of procedure:  The patient was taken to the operating room and general anesthesia was induced.  The patient was placed in the dorsal lithotomy position, prepped and draped in the usual sterile fashion, and preoperative antibiotics were administered. A preoperative time-out was performed.   A 21 French cystoscope was lubricated, passed per urethra and advanced proximally under direct vision with findings as described above.  A 45F open ended ureteral catheter was inserted into the left UO and advanced to the 10 cm mark.  10 mL of ICG was instilled without problems.  An identical procedure was performed on the contralateral side.  A 16 French Foley catheter was placed and the balloon was inflated with 10 cc of sterile water.  The catheter was initially clamped to allow bladder staining.  The patient was reprepped and draped for for her robotic assisted laparoscopic hysterectomy which will be dictated separately by Dr. Verdon Glendia JAYSON Barba, M.D.  "

## 2024-11-15 NOTE — Discharge Instructions (Signed)
Discharge instructions after  robotically-assisted total laparoscopic hysterectomy   For the next three days, take ibuprofen and acetaminophen on a schedule, every 8 hours. You can take them together or you can intersperse them, and take one every four hours. I also gave you gabapentin for nighttime, to help you sleep and also to control pain. Take gabapentin medicines at night for at least the next 3 nights. You also have a narcotic, oxycodone, to take as needed if the above medicines don't help.  Postop constipation is a major cause of pain. Stay well hydrated, walk as you tolerate, and take over the counter senna as well as stool softeners if you need them.   Signs and Symptoms to Report Call our office at [phone] if you have any of the following.   Fever over 100.4 degrees or higher  Severe stomach pain not relieved with pain medications  Bright red bleeding that's heavier than a period that does not slow with rest  To go the bathroom a lot (frequency), you can't hold your urine (urgency), or it hurts when you empty your bladder (urinate)  Chest pain  Shortness of breath  Pain in the calves of your legs  Severe nausea and vomiting not relieved with anti-nausea medications  Signs of infection around your wounds, such as redness, hot to touch, swelling, green/yellow drainage (like pus), bad smelling discharge  Any concerns  What You Can Expect after Surgery  You may see some pink tinged, bloody fluid and bruising around the wound. This is normal.  You may notice shoulder and neck pain. This is caused by the gas used during surgery to expand your abdomen so your surgeon could get to the uterus easier.  You may have a sore throat because of the tube in your mouth during general anesthesia. This will go away in 2 to 3 days.  You may have some stomach cramps.  You may notice spotting on your panties.  You may have pain around the incision sites.   Activities after Your  Discharge Follow these guidelines to help speed your recovery at home:  Do the coughing and deep breathing as you did in the hospital for 2 weeks. Use the small blue breathing device, called the incentive spirometer for 2 weeks.  Don't drive if you are in pain or taking narcotic pain medicine. You may drive when you can safely slam on the brakes, turn the wheel forcefully, and rotate your torso comfortably. This is typically 1-2 weeks. Practice in a parking lot or side street prior to attempting to drive regularly.   Ask others to help with household chores for 4 weeks.  Do not lift anything heavier that 10 pounds for 4-6 weeks. This includes pets, children, and groceries.  Don't do strenuous activities, exercises, or sports like vacuuming, tennis, squash, etc. until your doctor says it is safe to do so. ---Maintain pelvic rest for 12 weeks. This means nothing in the vagina or rectum at all (no douching, tampons, intercourse) for 12 weeks.   Walk as you feel able. Rest often since it may take two or three weeks for your energy level to return to normal.   You may climb stairs  Avoid constipation:   -Eat fruits, vegetables, and whole grains. Eat small meals as your appetite will take time to return to normal.   -Drink 6 to 8 glasses of water each day unless your doctor has told you to limit your fluids.   -Use a laxative or  stool softener as needed if constipation becomes a problem. You may take Miralax, metamucil, Citrucil, Colace, Senekot, FiberCon, etc. If this does not relieve the constipation, try two tablespoons of Milk Of Magnesia every 8 hours until your bowels move.   You may shower. Gently wash the wounds with a mild soap and water. Pat dry.  Do not get in a hot tub, swimming pool, etc. for 6 weeks.  Do not use lotions, oils, powders on the wounds.  Do not douche, use tampons, or have sex until your doctor says it is okay.  Take your pain medicine when you need it. The medicine may not  work as well if the pain is bad.  Take the medicines you were taking before surgery. Other medications you will need are pain medications (Norco or Percocet) and nausea medications (Zofran).

## 2024-11-15 NOTE — Anesthesia Postprocedure Evaluation (Signed)
"   Anesthesia Post Note  Patient: Sophia [name]  Procedure(s) Performed: HYSTERECTOMY, TOTAL, ROBOT-ASSISTED, LAPAROSCOPIC, WITH BILATERAL SALPINGO-OOPHORECTOMY; RIGHT URETROLYSIS (Bilateral: Abdomen) CYSTOSCOPY WITH INDOCYANINE GREEN  IMAGING (ICG) (Ureter)  Patient location during evaluation: PACU Anesthesia Type: General Level of consciousness: awake and alert Pain management: pain level controlled Vital Signs Assessment: post-procedure vital signs reviewed and stable Respiratory status: spontaneous breathing, nonlabored ventilation, respiratory function stable and patient connected to nasal cannula oxygen Cardiovascular status: blood pressure returned to baseline and stable Postop Assessment: no apparent nausea or vomiting Anesthetic complications: no   No notable events documented.   Last Vitals:  Vitals:   11/15/24 1647 11/15/24 1735  BP: (!) 147/70 (!) 160/84  Pulse: 83 84  Resp: 18   Temp:  (!) 36.1 C  SpO2: 98% 96%    Last Pain:  Vitals:   11/15/24 1735  TempSrc: Temporal  PainSc: 0-No pain                 Sophia [name]      "

## 2024-11-15 NOTE — Op Note (Signed)
 Sophia [name] PROCEDURE DATE: 11/15/2024  PREOPERATIVE DIAGNOSIS: Uterine fibroids POSTOPERATIVE DIAGNOSIS: The same PROCEDURE:  Panel 1 HYSTERECTOMY, TOTAL, ROBOT-ASSISTED, LAPAROSCOPIC, WITH BILATERAL SALPINGO-OOPHORECTOMY; RIGHT URETROLYSIS: 58571 (CPT)  Panel 2 CYSTOSCOPY WITH INDOCYANINE GREEN  IMAGING (ICG): 52005 (CPT)   SURGEON:  Dr. Heather Penton, MD ASSISTANT: Dr. Garnette Mace, MD  Anesthesiologist:  Anesthesiologist: Chesley Lendia CROME, MD CRNA: Duwayne Craven, CRNA; Hayes, Crysta, CRNA; Governor Selinda NOVAK, CRNA  An experienced assistant was required given the standard of surgical care given the complexity of the case.  This assistant was needed for exposure, dissection, suctioning, retraction, instrument exchange: Dr. Mace provided expert assistance required for safety during the procedure.   Modifer 22 for broad ligament fibroids and 1 that with anterior and displaced the bladder, and right through the uterine artery.  The ureter on this side also dived behind the fibroid at the area of the trigone.  Dissecting out this fibroid and managing potential blood loss while avoiding damage to the urinary tract system more than 50% of this case, and significantly increased the time spent.  However, the case was still able to be done minimally invasively, improving her surgical outcome and allowing her for discharge the same day.  INDICATIONS: 64 y.o. F  here for definitive surgical management secondary to the indications listed under preoperative diagnoses; please see preoperative note for further details.  Risks of surgery were discussed with the patient including but not limited to: bleeding which may require transfusion or reoperation; infection which may require antibiotics; injury to bowel, bladder, ureters or other surrounding organs; need for additional procedures; thromboembolic phenomenon, incisional problems and other postoperative/anesthesia complications. Written  informed consent was obtained.    FINDINGS:    External genitalia, vaginal canal and cervix negative for lesions. Intraoperative findings revealed a normal upper abdomen including bowel, liver, diaphragmatic surfaces, stomach, and omentum.  There was omental scar tissue to the anterior abdominal wall, but less than I would expect.  The left upper quadrant and the right upper quadrant had minimal omental adhesions.  The uterus was enlarged, boggy, and with multiple fibroids. Of note, there was a large fibroid in the broad ligament on the right, with a second large fibroid at the junction between the uterine artery and the cervix.  This fibroid extended into the pelvic sidewall and displaced the bladder on the right.  The specimen measured 251 g in the operative room.  The right and left ovaries appeared normal.  Bilateral tubes appeared normal.   ANESTHESIA:    General INTRAVENOUS FLUIDS:1100  ml ESTIMATED BLOOD LOSS:20 ml URINE OUTPUT: 900 ml  SPECIMENS: Uterus, cervix, bilateral fallopian tubes and bilateral ovaries COMPLICATIONS: None immediate   RATLH/BS:  PROCEDURE IN DETAIL: After informed consent was obtained, the patient was taken to the operating room where general anesthesia was obtained without difficulty. The patient was positioned in the dorsal lithotomy position in [place] stirrups and her arms were carefully tucked at her sides and the usual precautions were taken. Deep Trendelenburg (20-25 deg) was established to confirm that she does not shift on the table.  She was prepped and draped in normal sterile fashion.  Time-out was performed and a Foley catheter was placed into the bladder. A standard VCare uterine manipulator was then placed in the uterus without incident.  Preoperative prophylactic antibiotics were given through her iv.  After infiltration of local anesthetic at the proposed trocar sites, an 8 mm incision was created at the umbilicus, and an AirSeal 5mm was  placed  under direct visualization, after confirmation of OG tube working well. Pneumoperitoneum was created to a pressure of 15 mm Hg. The camera was placed and the abdomin surveyed, noting intact bowel below the site of entry. A survey of the pelvis and upper abdomen revealed the above findings. Two right and one left lateral 8-mm robotic ports were placed under direct visualization.  The patient was placed in deepTrendelenburg and the bowel was displaced up into the upper abdomen. The robot was left side docked. The instruments were placed under direct visualization.   The ureters were identified bilaterally coursing outside of the operative field.  The ureters and the bladder were easily able to be visualized where they ran beneath the peritoneum with ICG.  Round ligaments were divided on each side with the EndoShears and the retroperitoneal space was opened bilaterally.  On the right side, the ureter was identified on the medial leaflet of the broad ligament.  It was carefully dissected out to where it ran under the fibroid.  Control of bleeding at the IP bilaterally and at the left uterine artery insertion was assured with cautery and cut.  The posterior leaflet of the broad was taken down to the level of the IP ligament. The anterior leaflet of the broad ligament was carefully taken down to the midline.  A bladder flap was created and the bladder was dissected down off the lower uterine segment and cervix using endoshears and electrocautery.   Ovariolysis was performed and the ovary was dissected medially with care to avoid the ureter.  The infundibulopelvic ligaments were skeletonized, sealed and divided. The peritoneum was taken down to the level of the internal os, and the uterine arteries skeletonized. With strong cephalad pressure from the V-care, bipolar cautery was used to seal and transect the left uterine artery, and the pedicles allowed to fall away laterally.  A colpotomy was performed  circumferentially along the V-Care ring with monopolar electrocautery and the cervix was incised from the vagina using the laparoscopic scissors.     A pneumo balloon was placed in the vagina and a large Endobag was placed vaginally.  The uterus and replaced into the bag, which was then brought up through the umbilical port.    The vaginal cuff was then closed in a running continuous fashion using the  0 barbed PDS suture with careful attention to include the vaginal cuff angles, the uterosacral ligaments and the vaginal mucosa within the closure.  Hemostasis was secured with intraabdominal pressure and review of all surgical sites. The intraperitoneal pressure was dropped, and all planes of dissection, vascular pedicles and the vaginal cuff were found to be hemostatic.  The robot was undocked.   The umbilical incision was extended to 3 cm, the bag was brought out, and the Thersia was placed in the bag.  The specimen was removed through hand morcellation using the excite technique.  The fascia of this incision was closed with 0 Vicryl in an interrupted fashion, and the skin incisions were closed with 4-0 Monocryl subcuticular stitch and Dermabond.    Anesthesia was reversed without difficulty.  The patient tolerated the procedure well.  Sponge, lap and needle counts were correct x2.  The patient was taken to recovery room in excellent condition.

## 2024-11-15 NOTE — Anesthesia Procedure Notes (Signed)
 Procedure Name: Intubation Date/Time: 11/15/2024 11:42 AM  Performed by: Duwayne Craven, CRNAPre-anesthesia Checklist: Patient identified, Patient being monitored, Timeout performed, Emergency Drugs available and Suction available Patient Re-evaluated:Patient Re-evaluated prior to induction Oxygen Delivery Method: Circle system utilized Preoxygenation: Pre-oxygenation with 100% oxygen Induction Type: IV induction Ventilation: Mask ventilation without difficulty Laryngoscope Size: Mac and 3 Grade View: Grade II Tube type: Oral Tube size: 7.0 mm Number of attempts: 1 Airway Equipment and Method: Stylet Placement Confirmation: ETT inserted through vocal cords under direct vision, positive ETCO2 and breath sounds checked- equal and bilateral Secured at: 21 cm Tube secured with: Tape Dental Injury: Teeth and Oropharynx as per pre-operative assessment

## 2024-11-16 ENCOUNTER — Encounter: Payer: Self-pay | Admitting: Obstetrics and Gynecology
